# Patient Record
Sex: Male | Born: 1970 | Race: White | Hispanic: No | Marital: Married | Smoking: Former smoker
Health system: Southern US, Community
[De-identification: ages and names within clinical notes are randomized; demographics above are authoritative.]

## PROBLEM LIST (undated history)

## (undated) DIAGNOSIS — I10 Essential (primary) hypertension: Secondary | ICD-10-CM

## (undated) DIAGNOSIS — I2510 Atherosclerotic heart disease of native coronary artery without angina pectoris: Secondary | ICD-10-CM

## (undated) DIAGNOSIS — R569 Unspecified convulsions: Secondary | ICD-10-CM

## (undated) DIAGNOSIS — I219 Acute myocardial infarction, unspecified: Secondary | ICD-10-CM

## (undated) DIAGNOSIS — K219 Gastro-esophageal reflux disease without esophagitis: Secondary | ICD-10-CM

## (undated) DIAGNOSIS — R002 Palpitations: Secondary | ICD-10-CM

## (undated) HISTORY — PX: CHOLECYSTECTOMY: SHX55

## (undated) HISTORY — PX: APPENDECTOMY: SHX54

## (undated) HISTORY — PX: BRAIN SURGERY: SHX531

---

## 1997-08-17 DIAGNOSIS — I2510 Atherosclerotic heart disease of native coronary artery without angina pectoris: Secondary | ICD-10-CM

## 1997-08-17 HISTORY — DX: Atherosclerotic heart disease of native coronary artery without angina pectoris: I25.10

## 2001-01-18 ENCOUNTER — Ambulatory Visit (HOSPITAL_COMMUNITY): Admission: RE | Admit: 2001-01-18 | Discharge: 2001-01-18 | Payer: Self-pay | Admitting: Internal Medicine

## 2001-01-18 ENCOUNTER — Encounter: Payer: Self-pay | Admitting: Internal Medicine

## 2001-01-27 ENCOUNTER — Ambulatory Visit (HOSPITAL_COMMUNITY): Admission: RE | Admit: 2001-01-27 | Discharge: 2001-01-27 | Payer: Self-pay | Admitting: Internal Medicine

## 2001-01-27 ENCOUNTER — Encounter: Payer: Self-pay | Admitting: Internal Medicine

## 2001-02-11 ENCOUNTER — Ambulatory Visit (HOSPITAL_COMMUNITY): Admission: RE | Admit: 2001-02-11 | Discharge: 2001-02-11 | Payer: Self-pay | Admitting: Internal Medicine

## 2001-02-28 ENCOUNTER — Other Ambulatory Visit: Admission: RE | Admit: 2001-02-28 | Discharge: 2001-02-28 | Payer: Self-pay | Admitting: Internal Medicine

## 2001-03-03 ENCOUNTER — Encounter: Payer: Self-pay | Admitting: Internal Medicine

## 2001-03-03 ENCOUNTER — Ambulatory Visit (HOSPITAL_COMMUNITY): Admission: RE | Admit: 2001-03-03 | Discharge: 2001-03-03 | Payer: Self-pay | Admitting: Internal Medicine

## 2001-03-04 ENCOUNTER — Other Ambulatory Visit: Admission: RE | Admit: 2001-03-04 | Discharge: 2001-03-04 | Payer: Self-pay | Admitting: Internal Medicine

## 2001-03-04 ENCOUNTER — Encounter (INDEPENDENT_AMBULATORY_CARE_PROVIDER_SITE_OTHER): Payer: Self-pay | Admitting: Specialist

## 2001-05-05 ENCOUNTER — Ambulatory Visit (HOSPITAL_COMMUNITY): Admission: RE | Admit: 2001-05-05 | Discharge: 2001-05-05 | Payer: Self-pay | Admitting: Internal Medicine

## 2001-07-18 ENCOUNTER — Encounter: Payer: Self-pay | Admitting: Emergency Medicine

## 2001-07-18 ENCOUNTER — Inpatient Hospital Stay (HOSPITAL_COMMUNITY): Admission: EM | Admit: 2001-07-18 | Discharge: 2001-07-22 | Payer: Self-pay | Admitting: Cardiology

## 2001-07-18 ENCOUNTER — Encounter: Payer: Self-pay | Admitting: Cardiology

## 2001-07-19 ENCOUNTER — Encounter: Payer: Self-pay | Admitting: Cardiology

## 2001-07-28 ENCOUNTER — Encounter: Payer: Self-pay | Admitting: Emergency Medicine

## 2001-07-28 ENCOUNTER — Inpatient Hospital Stay (HOSPITAL_COMMUNITY): Admission: AD | Admit: 2001-07-28 | Discharge: 2001-07-29 | Payer: Self-pay | Admitting: Cardiology

## 2001-11-26 ENCOUNTER — Emergency Department (HOSPITAL_COMMUNITY): Admission: EM | Admit: 2001-11-26 | Discharge: 2001-11-27 | Payer: Self-pay | Admitting: Internal Medicine

## 2001-11-27 ENCOUNTER — Encounter: Payer: Self-pay | Admitting: Internal Medicine

## 2002-05-04 ENCOUNTER — Observation Stay (HOSPITAL_COMMUNITY): Admission: EM | Admit: 2002-05-04 | Discharge: 2002-05-05 | Payer: Self-pay | Admitting: *Deleted

## 2002-05-04 ENCOUNTER — Encounter: Payer: Self-pay | Admitting: *Deleted

## 2002-06-08 ENCOUNTER — Ambulatory Visit (HOSPITAL_COMMUNITY): Admission: RE | Admit: 2002-06-08 | Discharge: 2002-06-08 | Payer: Self-pay | Admitting: Internal Medicine

## 2004-07-07 ENCOUNTER — Ambulatory Visit: Payer: Self-pay | Admitting: Internal Medicine

## 2004-07-08 ENCOUNTER — Ambulatory Visit (HOSPITAL_COMMUNITY): Admission: RE | Admit: 2004-07-08 | Discharge: 2004-07-08 | Payer: Self-pay | Admitting: Internal Medicine

## 2004-07-22 ENCOUNTER — Ambulatory Visit (HOSPITAL_COMMUNITY): Admission: RE | Admit: 2004-07-22 | Discharge: 2004-07-22 | Payer: Self-pay | Admitting: Internal Medicine

## 2004-07-22 ENCOUNTER — Ambulatory Visit: Payer: Self-pay | Admitting: Internal Medicine

## 2004-09-03 ENCOUNTER — Ambulatory Visit: Payer: Self-pay | Admitting: Cardiology

## 2005-02-23 ENCOUNTER — Ambulatory Visit: Payer: Self-pay | Admitting: Cardiology

## 2005-02-27 ENCOUNTER — Ambulatory Visit: Payer: Self-pay

## 2005-02-27 ENCOUNTER — Ambulatory Visit: Payer: Self-pay | Admitting: Cardiology

## 2005-02-27 ENCOUNTER — Encounter: Payer: Self-pay | Admitting: Cardiovascular Disease

## 2005-04-07 ENCOUNTER — Ambulatory Visit: Payer: Self-pay

## 2005-06-04 ENCOUNTER — Ambulatory Visit: Payer: Self-pay | Admitting: Orthopedic Surgery

## 2005-06-29 ENCOUNTER — Ambulatory Visit: Payer: Self-pay | Admitting: Orthopedic Surgery

## 2005-08-04 ENCOUNTER — Ambulatory Visit: Payer: Self-pay | Admitting: Family Medicine

## 2005-11-30 ENCOUNTER — Ambulatory Visit: Payer: Self-pay | Admitting: Orthopedic Surgery

## 2006-01-19 ENCOUNTER — Ambulatory Visit: Payer: Self-pay | Admitting: Family Medicine

## 2006-04-06 ENCOUNTER — Ambulatory Visit: Payer: Self-pay | Admitting: Cardiology

## 2006-04-20 ENCOUNTER — Ambulatory Visit: Payer: Self-pay | Admitting: Family Medicine

## 2006-06-17 ENCOUNTER — Ambulatory Visit: Payer: Self-pay | Admitting: Orthopedic Surgery

## 2006-07-14 ENCOUNTER — Ambulatory Visit: Payer: Self-pay | Admitting: Family Medicine

## 2006-07-16 ENCOUNTER — Encounter: Payer: Self-pay | Admitting: Family Medicine

## 2006-07-16 DIAGNOSIS — K219 Gastro-esophageal reflux disease without esophagitis: Secondary | ICD-10-CM | POA: Insufficient documentation

## 2006-07-16 DIAGNOSIS — O88219 Thromboembolism in pregnancy, unspecified trimester: Secondary | ICD-10-CM | POA: Insufficient documentation

## 2006-07-16 DIAGNOSIS — E785 Hyperlipidemia, unspecified: Secondary | ICD-10-CM

## 2006-07-16 DIAGNOSIS — K648 Other hemorrhoids: Secondary | ICD-10-CM | POA: Insufficient documentation

## 2006-07-16 DIAGNOSIS — I2510 Atherosclerotic heart disease of native coronary artery without angina pectoris: Secondary | ICD-10-CM

## 2006-07-16 DIAGNOSIS — I10 Essential (primary) hypertension: Secondary | ICD-10-CM

## 2006-07-16 DIAGNOSIS — G40909 Epilepsy, unspecified, not intractable, without status epilepticus: Secondary | ICD-10-CM | POA: Insufficient documentation

## 2006-08-26 ENCOUNTER — Ambulatory Visit: Payer: Self-pay | Admitting: Orthopedic Surgery

## 2006-10-25 ENCOUNTER — Encounter (INDEPENDENT_AMBULATORY_CARE_PROVIDER_SITE_OTHER): Payer: Self-pay | Admitting: Family Medicine

## 2006-11-10 ENCOUNTER — Telehealth (INDEPENDENT_AMBULATORY_CARE_PROVIDER_SITE_OTHER): Payer: Self-pay | Admitting: Family Medicine

## 2006-12-14 ENCOUNTER — Ambulatory Visit: Payer: Self-pay | Admitting: Family Medicine

## 2006-12-14 DIAGNOSIS — J301 Allergic rhinitis due to pollen: Secondary | ICD-10-CM | POA: Insufficient documentation

## 2006-12-14 DIAGNOSIS — R42 Dizziness and giddiness: Secondary | ICD-10-CM

## 2006-12-15 ENCOUNTER — Encounter (INDEPENDENT_AMBULATORY_CARE_PROVIDER_SITE_OTHER): Payer: Self-pay | Admitting: Family Medicine

## 2006-12-15 LAB — CONVERTED CEMR LAB
ALT: 30 units/L (ref 0–53)
AST: 18 units/L (ref 0–37)
Albumin: 4.2 g/dL (ref 3.5–5.2)
Alkaline Phosphatase: 103 units/L (ref 39–117)
BUN: 11 mg/dL (ref 6–23)
CO2: 19 meq/L (ref 19–32)
Calcium: 9.2 mg/dL (ref 8.4–10.5)
Chloride: 103 meq/L (ref 96–112)
Creatinine, Ser: 0.94 mg/dL (ref 0.40–1.50)
Glucose, Bld: 87 mg/dL (ref 70–99)
Phenytoin Lvl: 7.8 ug/mL — ABNORMAL LOW (ref 10.0–20.0)
Potassium: 4.4 meq/L (ref 3.5–5.3)
Sodium: 136 meq/L (ref 135–145)
Total Bilirubin: 0.5 mg/dL (ref 0.3–1.2)
Total Protein: 7.4 g/dL (ref 6.0–8.3)

## 2007-02-25 ENCOUNTER — Ambulatory Visit (HOSPITAL_COMMUNITY): Admission: RE | Admit: 2007-02-25 | Discharge: 2007-02-25 | Payer: Self-pay | Admitting: Preventative Medicine

## 2007-05-06 ENCOUNTER — Ambulatory Visit: Payer: Self-pay | Admitting: Cardiology

## 2007-05-10 ENCOUNTER — Encounter (INDEPENDENT_AMBULATORY_CARE_PROVIDER_SITE_OTHER): Payer: Self-pay | Admitting: Family Medicine

## 2007-06-05 ENCOUNTER — Encounter: Payer: Self-pay | Admitting: Orthopedic Surgery

## 2007-06-20 ENCOUNTER — Ambulatory Visit: Payer: Self-pay | Admitting: Family Medicine

## 2007-06-20 LAB — CONVERTED CEMR LAB: Rapid Strep: NEGATIVE

## 2007-08-29 ENCOUNTER — Encounter (INDEPENDENT_AMBULATORY_CARE_PROVIDER_SITE_OTHER): Payer: Self-pay | Admitting: Family Medicine

## 2007-09-26 ENCOUNTER — Ambulatory Visit: Payer: Self-pay | Admitting: Internal Medicine

## 2007-11-15 ENCOUNTER — Ambulatory Visit: Payer: Self-pay | Admitting: Family Medicine

## 2007-11-15 DIAGNOSIS — J029 Acute pharyngitis, unspecified: Secondary | ICD-10-CM | POA: Insufficient documentation

## 2007-11-15 LAB — CONVERTED CEMR LAB: Rapid Strep: NEGATIVE

## 2008-05-04 ENCOUNTER — Ambulatory Visit: Payer: Self-pay | Admitting: Family Medicine

## 2008-05-14 ENCOUNTER — Ambulatory Visit: Payer: Self-pay | Admitting: Cardiology

## 2008-05-19 ENCOUNTER — Telehealth (INDEPENDENT_AMBULATORY_CARE_PROVIDER_SITE_OTHER): Payer: Self-pay | Admitting: Family Medicine

## 2008-05-23 ENCOUNTER — Ambulatory Visit: Payer: Self-pay | Admitting: Orthopedic Surgery

## 2008-05-23 DIAGNOSIS — M79609 Pain in unspecified limb: Secondary | ICD-10-CM

## 2008-05-23 DIAGNOSIS — M7150 Other bursitis, not elsewhere classified, unspecified site: Secondary | ICD-10-CM

## 2008-07-10 ENCOUNTER — Encounter (INDEPENDENT_AMBULATORY_CARE_PROVIDER_SITE_OTHER): Payer: Self-pay | Admitting: Family Medicine

## 2008-10-04 DIAGNOSIS — I2510 Atherosclerotic heart disease of native coronary artery without angina pectoris: Secondary | ICD-10-CM | POA: Insufficient documentation

## 2009-04-18 ENCOUNTER — Encounter: Payer: Self-pay | Admitting: Cardiology

## 2009-07-09 ENCOUNTER — Ambulatory Visit: Payer: Self-pay | Admitting: Cardiology

## 2009-07-15 ENCOUNTER — Telehealth (INDEPENDENT_AMBULATORY_CARE_PROVIDER_SITE_OTHER): Payer: Self-pay | Admitting: *Deleted

## 2009-07-16 ENCOUNTER — Ambulatory Visit: Payer: Self-pay | Admitting: Cardiovascular Disease

## 2009-07-16 ENCOUNTER — Ambulatory Visit: Payer: Self-pay

## 2009-07-16 ENCOUNTER — Encounter (HOSPITAL_COMMUNITY): Admission: RE | Admit: 2009-07-16 | Discharge: 2009-08-14 | Payer: Self-pay | Admitting: Cardiology

## 2009-07-17 ENCOUNTER — Observation Stay (HOSPITAL_COMMUNITY): Admission: AD | Admit: 2009-07-17 | Discharge: 2009-07-19 | Payer: Self-pay | Admitting: Cardiology

## 2009-07-17 ENCOUNTER — Telehealth: Payer: Self-pay | Admitting: Cardiology

## 2009-07-17 ENCOUNTER — Encounter: Payer: Self-pay | Admitting: Emergency Medicine

## 2009-07-17 ENCOUNTER — Ambulatory Visit: Payer: Self-pay | Admitting: Cardiology

## 2009-07-18 ENCOUNTER — Encounter: Payer: Self-pay | Admitting: Cardiology

## 2009-08-28 ENCOUNTER — Telehealth: Payer: Self-pay | Admitting: Cardiology

## 2009-08-29 ENCOUNTER — Encounter (INDEPENDENT_AMBULATORY_CARE_PROVIDER_SITE_OTHER): Payer: Self-pay

## 2009-09-05 ENCOUNTER — Ambulatory Visit: Payer: Self-pay | Admitting: Cardiology

## 2009-09-05 DIAGNOSIS — I4949 Other premature depolarization: Secondary | ICD-10-CM

## 2009-12-25 ENCOUNTER — Encounter (INDEPENDENT_AMBULATORY_CARE_PROVIDER_SITE_OTHER): Payer: Self-pay | Admitting: *Deleted

## 2010-01-22 ENCOUNTER — Telehealth: Payer: Self-pay | Admitting: Cardiology

## 2010-01-23 ENCOUNTER — Ambulatory Visit: Payer: Self-pay | Admitting: Cardiology

## 2010-01-23 DIAGNOSIS — R002 Palpitations: Secondary | ICD-10-CM | POA: Insufficient documentation

## 2010-04-29 ENCOUNTER — Ambulatory Visit: Payer: Self-pay | Admitting: Cardiology

## 2010-04-29 ENCOUNTER — Encounter: Payer: Self-pay | Admitting: Cardiology

## 2010-05-19 ENCOUNTER — Telehealth: Payer: Self-pay | Admitting: Cardiology

## 2010-05-20 ENCOUNTER — Ambulatory Visit: Payer: Self-pay | Admitting: Cardiology

## 2010-05-20 ENCOUNTER — Ambulatory Visit: Payer: Self-pay

## 2010-05-20 ENCOUNTER — Ambulatory Visit (HOSPITAL_COMMUNITY): Admission: RE | Admit: 2010-05-20 | Discharge: 2010-05-20 | Payer: Self-pay

## 2010-05-20 ENCOUNTER — Encounter: Payer: Self-pay | Admitting: Cardiology

## 2010-05-20 DIAGNOSIS — R109 Unspecified abdominal pain: Secondary | ICD-10-CM | POA: Insufficient documentation

## 2010-05-21 ENCOUNTER — Encounter: Payer: Self-pay | Admitting: Cardiology

## 2010-05-26 ENCOUNTER — Telehealth: Payer: Self-pay | Admitting: Cardiology

## 2010-06-04 ENCOUNTER — Ambulatory Visit: Payer: Self-pay | Admitting: Cardiology

## 2010-06-11 ENCOUNTER — Ambulatory Visit: Payer: Self-pay | Admitting: Internal Medicine

## 2010-06-25 ENCOUNTER — Telehealth (INDEPENDENT_AMBULATORY_CARE_PROVIDER_SITE_OTHER): Payer: Self-pay | Admitting: *Deleted

## 2010-08-22 ENCOUNTER — Ambulatory Visit: Admit: 2010-08-22 | Payer: Self-pay | Admitting: Cardiology

## 2010-09-14 LAB — CONVERTED CEMR LAB
BUN: 12 mg/dL (ref 6–23)
CO2: 27 meq/L (ref 19–32)
Calcium: 9.1 mg/dL (ref 8.4–10.5)
Chloride: 106 meq/L (ref 96–112)
Creatinine, Ser: 0.9 mg/dL (ref 0.4–1.5)
Ferritin: 57.1 ng/mL (ref 22.0–322.0)
GFR calc non Af Amer: 96.84 mL/min (ref >60)
Glucose, Bld: 97 mg/dL (ref 70–99)
Magnesium: 2.3 mg/dL (ref 1.5–2.5)
Potassium: 4.1 meq/L (ref 3.5–5.1)
Sodium: 139 meq/L (ref 135–145)
TSH: 1.35 microintl units/mL (ref 0.35–5.50)

## 2010-09-18 NOTE — Assessment & Plan Note (Signed)
Summary: EPH   Visit Type:  Post-hospital Primary Provider:  Franchot Heidelberg, MD  CC:  palpitations- Chest pain.  History of Present Illness: Patient has had some mild chest discomfort from time to time, but no prediictable episodes of pain.  The medicine seems to have helped some.  Data reviewed in detail with patient and wife.  He forgets to take his Dilantin.  Also, monitor strips reviewed in detail with patient, and demonstrate mostly singlet, unifocal VPB's  Current Medications (verified): 1)  Phenytoin Sodium Extended 100 Mg Caps (Phenytoin Sodium Extended) .... 2 Tablets Two Times A Day 2)  Prilosec 20 Mg Cpdr (Omeprazole) .... Two Times A Day 3)  Aspirin 81 Mg Tbec (Aspirin) .... Once Daily 4)  Nitro-Dur 0.4 Mg/hr Pt24 (Nitroglycerin) .... As Needed 5)  Crestor 40 Mg Tabs (Rosuvastatin Calcium) .... Take One Tablet By Mouth Daily. 6)  Metoprolol Succinate 25 Mg Xr24h-Tab (Metoprolol Succinate) .... Take One Tablet By Mouth Daily  Allergies (verified): No Known Drug Allergies  Vital Signs:  Patient profile:   40 year old male Height:      68 inches Weight:      177.75 pounds BMI:     27.12 Pulse rate:   68 / minute Pulse rhythm:   regular Resp:     18 per minute BP sitting:   122 / 70  (left arm) Cuff size:   large  Vitals Entered By: Vikki Ports (September 05, 2009 3:05 PM)  Physical Exam  General:  Well developed, well nourished, in no acute distress. Neck:  Neck supple, no JVD. No masses, thyromegaly or abnormal cervical nodes. Lungs:  Clear bilaterally to auscultation and percussion. Heart:  Non-displaced PMI, chest non-tender; regular rate and rhythm, S1, S2 without murmurs, rubs or gallops. Carotid upstroke normal, no bruit. Normal abdominal aortic size, no bruits. Femorals normal pulses, no bruits. Pedals normal pulses. No edema, no varicosities. Abdomen:  Bowel sounds positive; abdomen soft and non-tender without masses, organomegaly, or hernias noted. No  hepatosplenomegaly. Pulses:  pulses normal in all 4 extremities Extremities:  No clubbing or cyanosis. Neurologic:  Alert and oriented x 3.   EKG  Procedure date:  09/05/2009  Findings:      NSR. WNL.  Cardiac Cath  Procedure date:  07/18/2009  Findings:      ANGIOGRAPHIC DATA: 1. On plain fluoroscopy, there was a fair amount of calcification in     the coronary vessels.  Especially for age. 2. Ventriculography was done in the RAO projection.  Overall ejection     fraction will be estimated around 45-50% with an inferobasal area     of severe hypo-to-near akinesis. 3. The left main is free of critical disease.  It is slightly tapered     near the ostium. 4. The left anterior descending artery courses to the apex.  There is     very mild progression of proximal plaquing with about 30-40%     narrowing with a long area of calcification at the proximal mid     vessel.  None of the mid, distal vessel, or diagonal branch appears     to be critically narrow.  Luminal irregularities are noted. 5. The circumflex has about 40% ostial narrowing.  He is less     constructed than on the diagnostic study from 2002.  There is     calcification of the proximal bend after a long left main.  There     is a hypodense lesion noted  leading into the marginal.  This was     carefully on the LAO views.  However, the area appears to be     relatively smooth.  The distal vessel terminates as a circumflex     marginal with 2 sub-branches and noncritical disease despite     luminal irregularities. 6. The right coronary artery demonstrates an area of stented vessel in     the proximal mid artery.  There are overlapping stents.  There is     some mild tissue about 40% narrowing, but does not appear to be     high-grade.  There is calcification in the distal vessel with about     30% plaquing and also mild bifurcational disease of the PDA takeoff     and the continuation branch.  Critical disease is  not noted.   CONCLUSION: 1. Inferobasal hypokinesis to akinesis with mild reduction in left     ventricular function. 2. Frequent ventricular ectopy, mainly has trigeminy and bigeminy with     symptoms. 3. Patent stents from the previous myocardial infarction. 4. Moderate circumflex disease compared to the previous study,  Impression & Recommendations:  Problem # 1:  CAD, NATIVE VESSEL (ICD-414.01) RCA remains patent.  CFX looks similar to 2002 film.  Reviewed in detail with patient and wife, including film review.   The following medications were removed from the medication list:    Atenolol 25 Mg Tabs (Atenolol) ..... Once daily His updated medication list for this problem includes:    Aspirin 81 Mg Tbec (Aspirin) ..... Once daily    Nitro-dur 0.4 Mg/hr Pt24 (Nitroglycerin) .Marland Kitchen... As needed    Metoprolol Succinate 50 Mg Xr24h-tab (Metoprolol succinate) .Marland Kitchen... Take one tablet by mouth daily  Orders: EKG w/ Interpretation (93000)  Problem # 2:  PREMATURE VENTRICULAR CONTRACTIONS (ICD-427.69) See monitor results. reviewed in detail.  Will increase beta blocker to 50mg  daily.  Continue to watch. The following medications were removed from the medication list:    Atenolol 25 Mg Tabs (Atenolol) ..... Once daily His updated medication list for this problem includes:    Aspirin 81 Mg Tbec (Aspirin) ..... Once daily    Nitro-dur 0.4 Mg/hr Pt24 (Nitroglycerin) .Marland Kitchen... As needed    Metoprolol Succinate 50 Mg Xr24h-tab (Metoprolol succinate) .Marland Kitchen... Take one tablet by mouth daily  Problem # 3:  SEIZURE DISORDER (ICD-780.39) Needs to resume normal dose with followup Dr. Sharene Skeans. His updated medication list for this problem includes:    Phenytoin Sodium Extended 100 Mg Caps (Phenytoin sodium extended) .Marland Kitchen... 2 tablets two times a day  Problem # 4:  HYPERLIPIDEMIA (ICD-272.4) Will see in close followup.  Will need lipid and liver profile. The following medications were removed from the  medication list:    Crestor 20 Mg Tabs (Rosuvastatin calcium) ..... Once daily His updated medication list for this problem includes:    Crestor 40 Mg Tabs (Rosuvastatin calcium) .Marland Kitchen... Take one tablet by mouth daily.  Patient Instructions: 1)  Your physician recommends that you schedule a follow-up appointment in: 1 MONTH 2)  Your physician has recommended you make the following change in your medication: INCREASE Metoprolol Succinate to 50mg  one tablet daily Prescriptions: METOPROLOL SUCCINATE 50 MG XR24H-TAB (METOPROLOL SUCCINATE) Take one tablet by mouth daily  #30 x 11   Entered by:   Julieta Gutting, RN, BSN   Authorized by:   Ronaldo Miyamoto, MD, [facility]   Signed by:   Julieta Gutting, RN, BSN on 09/05/2009   Method  used:   Electronically to        [facility] (retail)       [address]       [place], [place]  [address]       Ph: [phone] or [phone]       Fax: [phone]   RxID:   [id number]

## 2010-09-18 NOTE — Progress Notes (Signed)
Summary: fluttering  Phone Note Call from Patient Call back at Home Phone [phone]   Caller: Spouse Reason for Call: Talk to Nurse Summary of Call: pt having fluttering... happened several times in the past week, request call back Initial call taken by: Migdalia Dk,  January 22, 2010 10:57 AM  Follow-up for Phone Call        Pt. c/o of having fast heart rate and skiping beats for the past week. These episodes lasted for 10 to 15 minutes on sunday.On monday and this AM the fast heart rate and skips lasted for an hr.  Pt states missed appointment on 12/01/09. He would like to be seen soon. An appointment was made for pt in Dr. Rosalyn Charters clinic on 01/23/10 at 11:45 AM. Pt aware. Follow-up by: Ollen Gross, RN, BSN,  January 22, 2010 11:45 AM

## 2010-09-18 NOTE — Assessment & Plan Note (Signed)
Summary: ? VT  add on per Dr Riley Kill     Primary Marietta Sikkema:  Andre [name]  CC:  dizziness...pt states his heart rate has been increased .  History of Present Illness: 40 year old male with past medical history of coronary disease for evaluation of palpitations. Previous PCI of his right coronary artery in 2002. Last cardiac catheterization performed in December 2010 and showed a an ejection fraction of 45-50%. There was a 40% LAD, a 40% circumflex and a 40% right coronary artery which has been treated medically.  Last echocardiogram in December of 2010 showed an ejection fraction of 55-60%, inferior hypokinesis and mild mitral regurgitation. Patient also with history of palpitations and monitor in December of 2010 showed PVCs. He contacted the office with increased palpitations and I was asked to further evaluate. The patient states that recently he is having increased palpitations. He describes these as a skip but also had sustained racing. There is associated dizziness but no syncope. There is no associated chest pain, shortness of breath. His sustained palpitations last anywhere from seconds to minutes. They resolve spontaneously. He otherwise denies dyspnea on exertion, orthopnea or exertional chest pain.  Current Medications (verified): 1)  Phenytoin Sodium Extended 100 Mg Caps (Phenytoin Sodium Extended) .... 2 Tablets Two Times A Day 2)  Prilosec 20 Mg Cpdr (Omeprazole) .... Two Times A Day 3)  Aspirin Ec 325 Mg Tbec (Aspirin) .... Take One Tablet By Mouth Daily 4)  Nitrostat 0.4 Mg Subl (Nitroglycerin) .Marland Kitchen.. 1 Tablet Under Tongue At Onset of Chest Pain; You May Repeat Every 5 Minutes For Up To 3 Doses. 5)  Crestor 40 Mg Tabs (Rosuvastatin Calcium) .... Take One Tablet By Mouth Daily. 6)  Metoprolol Succinate 50 Mg Xr24h-Tab (Metoprolol Succinate) .Marland Kitchen.. 1 1/2 Tab By Mouth Once Daily  Allergies: No Known Drug Allergies  Past History:  Past Medical History: CAD, NATIVE VESSEL  (ICD-414.01) TRIGGER FINGER, THUMB - BILATERAL (ICD-727.03) EMBOLISM, OB BLOOD-CLOT , UNSPECIFIED EPISODE (ICD-673.20) HEMORRHOIDS, INTERNAL (ICD-455.0) SEIZURE DISORDER (ICD-780.39) HYPERTENSION (ICD-401.9) HYPERLIPIDEMIA (ICD-272.4) GERD (ICD-530.81) CORONARY ARTERY DISEASE (ICD-414.00)S/P INFERIOR MI RX STENT RCA 07/18/01, RESIDUAL 70% CFX, 40-50% MID CFX, 40% LAD, 70%DX EF47%. 53% MYOVIEW 7/06    Coronary artery disease GERD Hyperlipidemia Hypertension Seizure disorder  Past Surgical History: Appendectomy Cholecystectomy DMI, treated with a bare-metal stent to the  right coronary in 2002.  Social History: Reviewed history from 10/04/2009 and no changes required. Married Alcohol use-no Drug use-no   He works in Production designer, theatre/television/film, Engineer, manufacturing systems   facilities for the [employer]  He does not smoke.      Review of Systems       Problems with abdominal pain but no fevers or chills, productive cough, hemoptysis, dysphasia, odynophagia, melena, hematochezia, dysuria, hematuria, rash, seizure activity, orthopnea, PND, pedal edema, claudication. Remaining systems are negative.   Vital Signs:  Patient profile:   40 year old male Height:      68 inches Weight:      181 pounds BMI:     27.62 Pulse rate:   70 / minute Resp:     14 per minute BP sitting:   124 / 77  (left arm)  Vitals Entered By: Kem Parkinson (May 20, 2010 2:27 PM)  Physical Exam  General:  Well-developed well-nourished in no acute distress.  Skin is warm and dry.  HEENT is normal.  Neck is supple. No thyromegaly.  Chest is clear to auscultation with normal expansion.  Cardiovascular exam is regular rate and rhythm.  Abdominal exam not  distended. No masses palpated. Mild diffuse tenderness. Extremities show no edema. neuro grossly intact    Impression & Recommendations:  Problem # 1:  PALPITATIONS (ICD-785.1) Etiology unclear. Check TSH, magnesium and potassium. Repeat  echocardiogram. Check cardiac monitor. Continue beta blocker. His updated medication list for this problem includes:    Aspirin Ec 325 Mg Tbec (Aspirin) .Marland Kitchen... Take one tablet by mouth daily    Nitrostat 0.4 Mg Subl (Nitroglycerin) .Marland Kitchen... 1 tablet under tongue at onset of chest pain; you may repeat every 5 minutes for up to 3 doses.    Metoprolol Succinate 50 Mg Xr24h-tab (Metoprolol succinate) .Marland Kitchen... 1 1/2 tab by mouth once daily  Orders: EKG w/ Interpretation (93000) TLB-BMP (Basic Metabolic Panel-BMET) (80048-METABOL) TLB-TSH (Thyroid Stimulating Hormone) (84443-TSH) TLB-Magnesium (Mg) (83735-MG) Event (Event) Echocardiogram (Echo)  Problem # 2:  CAD, NATIVE VESSEL (ICD-414.01)  No recent chest pain. Continue aspirin and beta blocker as well as statin. His updated medication list for this problem includes:    Aspirin Ec 325 Mg Tbec (Aspirin) .Marland Kitchen... Take one tablet by mouth daily    Nitrostat 0.4 Mg Subl (Nitroglycerin) .Marland Kitchen... 1 tablet under tongue at onset of chest pain; you may repeat every 5 minutes for up to 3 doses.    Metoprolol Succinate 50 Mg Xr24h-tab (Metoprolol succinate) .Marland Kitchen... 1 1/2 tab by mouth once daily  His updated medication list for this problem includes:    Aspirin Ec 325 Mg Tbec (Aspirin) .Marland Kitchen... Take one tablet by mouth daily    Nitrostat 0.4 Mg Subl (Nitroglycerin) .Marland Kitchen... 1 tablet under tongue at onset of chest pain; you may repeat every 5 minutes for up to 3 doses.    Metoprolol Succinate 50 Mg Xr24h-tab (Metoprolol succinate) .Marland Kitchen... 1 1/2 tab by mouth once daily  Problem # 3:  HYPERTENSION (ICD-401.9)  Blood pressure controlled on present medications. Will continue. His updated medication list for this problem includes:    Aspirin Ec 325 Mg Tbec (Aspirin) .Marland Kitchen... Take one tablet by mouth daily    Metoprolol Succinate 50 Mg Xr24h-tab (Metoprolol succinate) .Marland Kitchen... 1 1/2 tab by mouth once daily  His updated medication list for this problem includes:    Aspirin Ec 325 Mg  Tbec (Aspirin) .Marland Kitchen... Take one tablet by mouth daily    Metoprolol Succinate 50 Mg Xr24h-tab (Metoprolol succinate) .Marland Kitchen... 1 1/2 tab by mouth once daily  Problem # 4:  HYPERLIPIDEMIA (ICD-272.4)  Continue statin. His updated medication list for this problem includes:    Crestor 40 Mg Tabs (Rosuvastatin calcium) .Marland Kitchen... Take one tablet by mouth daily.  His updated medication list for this problem includes:    Crestor 40 Mg Tabs (Rosuvastatin calcium) .Marland Kitchen... Take one tablet by mouth daily.  Problem # 5:  GERD (ICD-530.81)  His updated medication list for this problem includes:    Prilosec 20 Mg Cpdr (Omeprazole) .Marland Kitchen..Marland Kitchen Two times a day  His updated medication list for this problem includes:    Prilosec 20 Mg Cpdr (Omeprazole) .Marland Kitchen..Marland Kitchen Two times a day  Problem # 6:  ABDOMINAL PAIN OTHER SPECIFIED SITE (ICD-789.09) Patient has mild abdominal pain. He apparently discussed this with Dr. Riley Kill previously. I've asked him to followup with his primary care physician for this issue.  Patient Instructions: 1)  Your physician recommends that you schedule a follow-up appointment in: 2 WEEKS WITH DR Riley Kill 2)  Your physician recommends that you return for lab work ZO:XWRUE BMET MAG TSH 785.1 3)  Your physician recommends that you continue  on your current medications as directed. Please refer to the Current Medication list given to you today. 4)  Your physician has requested that you have an echocardiogram.  Echocardiography is a painless test that uses sound waves to create images of your heart. It provides your doctor with information about the size and shape of your heart and how well your heart's chambers and valves are working.  This procedure takes approximately one hour. There are no restrictions for this procedure. 5)  Your physician has recommended that you wear an event monitor CARDIONET  Event monitors are medical devices that record the heart's electrical activity. Doctors most often use these  monitors to diagnose arrhythmias. Arrhythmias are problems with the speed or rhythm of the heartbeat. The monitor is a small, portable device. You can wear one while you do your normal daily activities. This is usually used to diagnose what is causing palpitations/syncope (passing out).

## 2010-09-18 NOTE — Progress Notes (Signed)
Summary: prob with monitor  Phone Note Call from Patient   Caller: Spouse [phone] Reason for Call: Talk to Nurse Summary of Call: wife wants pt to get a call today, re heart monitor, it has broken twice, pt now has welps on chest  Spoke with patient, he received the 2 types of electrodes we carry at time of service,.... referred him to call the 1-800 number on the cell phone/monitor box to request sensitive skin electrodes from [employer].  Pt was pleasant. Stanton Kidney, EMT-P  May 27, 2010 6:25 PM  Initial call taken by: Glynda Jaeger,  May 26, 2010 11:01 AM  Follow-up for Phone Call        pt's wife returning call from yesterday-call pt at [phone] Glynda Jaeger  May 27, 2010 2:43 PM

## 2010-09-18 NOTE — Assessment & Plan Note (Signed)
Summary: for a week/Fast HR & skip beats/NM   Visit Type:  rov Primary Provider:  Mcgough  CC:  pt states he has had some racing heart rate x 1 week....no other complaints today.  History of Present Illness: Has had several episodes of where his heart would begin racing.  Three of four episdose have occurred.  He drinks alot of soda., and so he most recently has gotten on to G2, and Sprite.  The longest spell lasts about ten minutes.   He has not had further since he changed his drinks.  Denies any chest pain at all.  No exertional symptoms.  In December, thyroids and D dimer were normal.  Dilantin level was low at that point.   Current Medications (verified): 1)  Phenytoin Sodium Extended 100 Mg Caps (Phenytoin Sodium Extended) .... 2 Tablets Two Times A Day 2)  Prilosec 20 Mg Cpdr (Omeprazole) .... Two Times A Day 3)  Aspirin 81 Mg Tbec (Aspirin) .... Once Daily 4)  Nitro-Dur 0.4 Mg/hr Pt24 (Nitroglycerin) .... As Needed 5)  Crestor 40 Mg Tabs (Rosuvastatin Calcium) .... Take One Tablet By Mouth Daily. 6)  Metoprolol Succinate 50 Mg Xr24h-Tab (Metoprolol Succinate) .... Take One Tablet By Mouth Daily  Allergies (verified): No Known Drug Allergies  Past History:  Past Medical History: Last updated: 10/04/2008 Current Problems:  CAD, NATIVE VESSEL (ICD-414.01) FINGER PAIN (ICD-729.5) TRIGGER FINGER (ICD-727.3) TRIGGER FINGER, THUMB - BILATERAL (ICD-727.03) PHARYNGITIS, ACUTE (ICD-462) ALLERGIC RHINITIS, SEASONAL (ICD-477.0) DIZZINESS (ICD-780.4) EMBOLISM, OB BLOOD-CLOT , UNSPECIFIED EPISODE (ICD-673.20) HEMORRHOIDS, INTERNAL (ICD-455.0) SEIZURE DISORDER (ICD-780.39) HYPERTENSION (ICD-401.9) HYPERLIPIDEMIA (ICD-272.4) GERD (ICD-530.81) CORONARY ARTERY DISEASE (ICD-414.00)S/P INFERIOR MI RX STENT RCA 07/18/01, RESIDUAL 70% CFX, 40-50% MID CFX, 40% LAD, 70%DX EF47%. 53% MYOVIEW 7/06    Coronary artery disease GERD Hyperlipidemia Hypertension Seizure disorder  Past  Surgical History: Last updated: 10/04/2009 Appendectomy Cholecystectomy  DMI, treated with a bare-metal stent to the  right coronary in 2002.  Family History: Last updated: 10/04/2009  Positive, both his mother and father had coronary   artery disease with heart attack.   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  He does not smoke.      Vital Signs:  Patient profile:   40 year old male Height:      68 inches Weight:      174 pounds BMI:     26.55 Pulse rate:   82 / minute Pulse rhythm:   irregular BP sitting:   120 / 80  (left arm) Cuff size:   regular  Vitals Entered By: Danielle Rankin, CMA (January 23, 2010 12:53 PM)  Physical Exam  General:  Well developed, well nourished, in no acute distress. Head:  normocephalic and atraumatic Eyes:  PERRLA/EOM intact; conjunctiva and lids normal. Lungs:  Clear bilaterally to auscultation and percussion. Heart:  Non-displaced PMI, chest non-tender; regular rate and rhythm, S1, S2 without murmurs, rubs or gallops. Carotid upstroke normal, no bruit. Abdomen:  Bowel sounds positive; abdomen soft and non-tender without masses, organomegaly, or hernias noted. No hepatosplenomegaly. Pulses:  pulses normal in all 4 extremities Extremities:  No clubbing or cyanosis. Neurologic:  Alert and oriented x 3.   EKG  Procedure date:  01/23/2010  Findings:      NSR. WNL.  No acute abnormalities.    Event Monitor  Procedure date:  08/25/2009  Findings:      Had  one month event monitor with just singlet VPBs.  No significant arrythmias.    EKG  Procedure date:  01/23/2010  Findings:      Add to report.  Inferior MI, indeterminate age.  Rage VPB.    Echocardiogram  Procedure date:  07/26/2009  Findings:       Study Conclusions    1. Left ventricle: The cavity size was normal. Wall thickness was       increased in a pattern of mild LVH. Systolic function was normal.      The estimated ejection fraction was in the range of 55% to 60%.      There appeared to be basal posterior and inferior severe      hypokinesis to akinesis. Diastolic function is indeterminant.   2. Aortic valve: There was no stenosis.   3. Mitral valve: Mild regurgitation.   4. Left atrium: The atrium was mildly dilated.   5. Right ventricle: The cavity size was normal. Systolic function      was normal.   6. Pulmonary arteries: No TR doppler jet was measured so unable to      estimate PA systolic pressure.   7. Systemic veins: The IVC was not visualized.   Impressions:    - Normal LV size with EF 55-60%. There was mild LV hypertrophy. The     basal inferior and posterior walls were severely hypokinetic to     akinetic. There was mild mitral regurgitation. The RV appeared     normal.  CXR  Procedure date:  07/17/2009  Findings:       Clinical Data: Chest pain/rapid heart beat    PORTABLE CHEST - 1 VIEW    Comparison: Report of a prior chest x-ray 05/04/2002.    Findings: Heart size within normal limits considering AP   projection.  Lungs clear in one-view.  No pleural fluid or osseous   lesions.    IMPRESSION:   No active disease in one-view.    Read By:  Bernerd Limbo,  M.D.   Released By:  Bernerd Limbo,  M.D.  Cardiac Cath  Procedure date:  07/18/2009  Findings:       CONCLUSION:   1. Inferobasal hypokinesis to akinesis with mild reduction in left       ventricular function.   2. Frequent ventricular ectopy, mainly has trigeminy and bigeminy with       symptoms.   3. Patent stents from the previous myocardial infarction.   4. Moderate circumflex disease compared to the previous study, it may       not be progressed.      DISPOSITION:  We will get several opinions regarding the films.  It is   not clear that his symptoms are entirely from this.  We have nuclear   imaging data and we  will get a repeat echocardiogram to make sure that   there has not been a change in other territories as well.  Further   decisions are pending.   Nuclear ETT  Procedure date:  07/16/2009  Findings:      QPS  Raw Data Images:  Normal; no motion artifact; normal heart/lung ratio. Stress Images:  NI: Uniform and normal uptake of tracer in all myocardial segments. Rest Images:  Normal homogeneous uptake in all areas of the myocardium. Subtraction (SDS):  SDS 4 abnormal at inferobase Transient Ischemic Dilatation:  1.12  (Normal <1.22)  Lung/Heart Ratio:  .36  (Normal <0.45)  Quantitative Gated Spect  Images  QGS cine images:  non-gated study  Findings  Low risk nuclear study  Evidence for inferior infarct    Impression & Recommendations:  Problem # 1:  PALPITATIONS (ICD-785.1) symptoms have improved since he cut back on some caffeine.  He will continue with his change in diet.  If further symptoms, or any prolonged, then should come to the ER for rhythm strip.  Had event monitor with singlet VPBs noted.  See report.   His updated medication list for this problem includes:    Aspirin 81 Mg Tbec (Aspirin) ..... Once daily    Nitro-dur 0.4 Mg/hr Pt24 (Nitroglycerin) .Marland Kitchen... As needed    Metoprolol Succinate 50 Mg Xr24h-tab (Metoprolol succinate) .Marland Kitchen... Take one tablet by mouth daily  Problem # 2:  PREMATURE VENTRICULAR CONTRACTIONS (ICD-427.69) See report.  LV function normal by 2D echo.  His updated medication list for this problem includes:    Aspirin 81 Mg Tbec (Aspirin) ..... Once daily    Nitro-dur 0.4 Mg/hr Pt24 (Nitroglycerin) .Marland Kitchen... As needed    Metoprolol Succinate 50 Mg Xr24h-tab (Metoprolol succinate) .Marland Kitchen... Take one tablet by mouth daily  Problem # 3:  CAD, NATIVE VESSEL (ICD-414.01) Continues to remain stable at this point in time.  See extensive workup from December.  Had nuc, cath, echo.  No current chest pain.  Continue to monitor at this point in time.  His updated  medication list for this problem includes:    Aspirin 81 Mg Tbec (Aspirin) ..... Once daily    Nitro-dur 0.4 Mg/hr Pt24 (Nitroglycerin) .Marland Kitchen... As needed    Metoprolol Succinate 50 Mg Xr24h-tab (Metoprolol succinate) .Marland Kitchen... Take one tablet by mouth daily  Problem # 4:  HYPERLIPIDEMIA (ICD-272.4) Will need followup labs at some point.  His updated medication list for this problem includes:    Crestor 40 Mg Tabs (Rosuvastatin calcium) .Marland Kitchen... Take one tablet by mouth daily.  Patient Instructions: 1)  Your physician recommends that you schedule a follow-up appointment in: 6 MONTHS

## 2010-09-18 NOTE — Progress Notes (Signed)
Summary: EPS +/-ILR/LM  Phone Note Outgoing Call   Call placed by: Dennis Bast, RN, BSN,  June 25, 2010 4:43 PM Call placed to: Patient Summary of Call: spoke to patients wife in regards to setting up EPS +/- ILR Pt was put hunting and she will have him call me to schedule procedure  Initial call taken by: Dennis Bast, RN, BSN,  June 25, 2010 4:45 PM  Follow-up for Phone Call        Dr Ladona Ridgel aware pt has never returned call to schedule procedure Dennis Bast, RN, BSN  July 15, 2010 6:04 PM

## 2010-09-18 NOTE — Assessment & Plan Note (Signed)
Summary: ROV   Visit Type:  Follow-up Primary Provider:  Mcgough   History of Present Illness: He is on  call alot.  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  No chest pain.  Metoprolol worked pretty good.  Then the second month she got some other medication, and felt fast heart rate and palpitations.  Apparently got the wrong med.  Then in a couple of days felt better.    Current Medications (verified): 1)  Phenytoin Sodium Extended 100 Mg Caps (Phenytoin Sodium Extended) .... 2 Tablets Two Times A Day 2)  Prilosec 20 Mg Cpdr (Omeprazole) .... Two Times A Day 3)  Aspirin Ec 325 Mg Tbec (Aspirin) .... Take One Tablet By Mouth Daily 4)  Nitrostat 0.4 Mg Subl (Nitroglycerin) .Marland Kitchen.. 1 Tablet Under Tongue At Onset of Chest Pain; You May Repeat Every 5 Minutes For Up To 3 Doses. 5)  Crestor 40 Mg Tabs (Rosuvastatin Calcium) .... Take One Tablet By Mouth Daily. 6)  Metoprolol Succinate 50 Mg Xr24h-Tab (Metoprolol Succinate) .... Take One Tablet By Mouth Daily  Allergies (verified): No Known Drug Allergies  Past History:  Past Medical History: Last updated: 10/04/2008 Current Problems:  CAD, NATIVE VESSEL (ICD-414.01) FINGER PAIN (ICD-729.5) TRIGGER FINGER (ICD-727.3) TRIGGER FINGER, THUMB - BILATERAL (ICD-727.03) PHARYNGITIS, ACUTE (ICD-462) ALLERGIC RHINITIS, SEASONAL (ICD-477.0) DIZZINESS (ICD-780.4) EMBOLISM, OB BLOOD-CLOT , UNSPECIFIED EPISODE (ICD-673.20) HEMORRHOIDS, INTERNAL (ICD-455.0) SEIZURE DISORDER (ICD-780.39) HYPERTENSION (ICD-401.9) HYPERLIPIDEMIA (ICD-272.4) GERD (ICD-530.81) CORONARY ARTERY DISEASE (ICD-414.00)S/P INFERIOR MI RX STENT RCA 07/18/01, RESIDUAL 70% CFX, 40-50% MID CFX, 40% LAD, 70%DX EF47%. 53% MYOVIEW 7/06    Coronary artery disease GERD Hyperlipidemia Hypertension Seizure disorder  Past Surgical History: Last updated: 10/04/2009 Appendectomy Cholecystectomy  DMI, treated with a bare-metal stent to the  right coronary in 2002.  Family  History: Last updated: 10/04/2009  Positive, both his mother and father had coronary   artery disease with heart attack.   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  He does not smoke.      Risk Factors: Smoking Status: quit (12/14/2006)  Vital Signs:  Patient profile:   40 year old male Height:      68 inches Weight:      181.75 pounds BMI:     27.73 Pulse rate:   63 / minute Pulse rhythm:   irregular Resp:     18 per minute BP sitting:   134 / 82  (left arm) Cuff size:   large  Vitals Entered By: Vikki Ports (April 29, 2010 2:57 PM)  Physical Exam  General:  Well developed, well nourished, in no acute distress. Head:  normocephalic and atraumatic Eyes:  PERRLA/EOM intact; conjunctiva and lids normal. Lungs:  Clear bilaterally to auscultation and percussion. Heart:  PMI non displaced.  Normal S1 and S2.  No mumur. Extremities:  No clubbing or cyanosis. Neurologic:  Alert and oriented x 3.   EKG  Procedure date:  04/29/2010  Findings:      NSR with frequent to occassional PVCs.  Impression & Recommendations:  Problem # 1:  CAD, NATIVE VESSEL (ICD-414.01)  stable at present time.  No angina at present. His updated medication list for this problem includes:    Aspirin Ec 325 Mg Tbec (Aspirin) .Marland Kitchen... Take one tablet by mouth daily    Nitrostat 0.4 Mg Subl (Nitroglycerin) .Marland Kitchen... 1 tablet under tongue at onset of chest pain;  you may repeat every 5 minutes for up to 3 doses.    Metoprolol Succinate 50 Mg Xr24h-tab (Metoprolol succinate) .Marland Kitchen... Take one tablet by mouth daily  Orders: EKG w/ Interpretation (93000)  Problem # 2:  HYPERLIPIDEMIA (ICD-272.4) CHecked at  Dr. Loyal Gambler office His updated medication list for this problem includes:    Crestor 40 Mg Tabs (Rosuvastatin calcium) .Marland Kitchen... Take one tablet by mouth daily.  Problem #  3:  HYPERTENSION (ICD-401.9) controlled. His updated medication list for this problem includes:    Aspirin Ec 325 Mg Tbec (Aspirin) .Marland Kitchen... Take one tablet by mouth daily    Metoprolol Succinate 50 Mg Xr24h-tab (Metoprolol succinate) .Marland Kitchen... Take one tablet by mouth daily  Problem # 4:  PREMATURE VENTRICULAR CONTRACTIONS (ICD-427.69) Had an event montior, and just one singlet PVC.  Currently stable, and remains on beta blockade.  Cath done in the last year, and see results.  Discussed with patient.  His updated medication list for this problem includes:    Aspirin Ec 325 Mg Tbec (Aspirin) .Marland Kitchen... Take one tablet by mouth daily    Nitrostat 0.4 Mg Subl (Nitroglycerin) .Marland Kitchen... 1 tablet under tongue at onset of chest pain; you may repeat every 5 minutes for up to 3 doses.    Metoprolol Succinate 50 Mg Xr24h-tab (Metoprolol succinate) .Marland Kitchen... Take one tablet by mouth daily  Patient Instructions: 1)  Your physician recommends that you continue on your current medications as directed. Please refer to the Current Medication list given to you today. 2)  Your physician wants you to follow-up in: 6 MONTHS.  You will receive a reminder letter in the mail two months in advance. If you don't receive a letter, please call our office to schedule the follow-up appointment. Prescriptions: NITROSTAT 0.4 MG SUBL (NITROGLYCERIN) 1 tablet under tongue at onset of chest pain; you may repeat every 5 minutes for up to 3 doses.  #25 x 2   Entered by:   Julieta Gutting, RN, BSN   Authorized by:   Ronaldo Miyamoto, MD, [facility]   Signed by:   Julieta Gutting, RN, BSN on 04/29/2010   Method used:   Electronically to        [facility]* (retail)       [address]       [place], [place]  [address]       Ph: [phone] or [phone]       Fax: [phone]   RxID:   [id number]

## 2010-09-18 NOTE — Progress Notes (Signed)
Summary: c/o heart racing for 5-6- days  Phone Note Call from Patient Call back at Home Phone [phone] Call back at [phone]- wife work #   Caller: Spouse- tina Reason for Call: Talk to Nurse Summary of Call: per pt wife calling. c/o heart racing at time for about 5-6- day now. no cp, sob, skipping beat about 30 seconds.  Initial call taken by: Lorne Skeens,  May 19, 2010 11:59 AM  Follow-up for Phone Call        The pt's wife called because she is concerned about the pt complaining of a fast heart rate.  I left a message for the pt to call back (cell [phone]). Julieta Gutting, RN, BSN  May 19, 2010 12:53 PM  I spoke with the pt and he is having a fast heart rate at least 4-5 times per day and this has been happening for one week.  The pt denies CP but does feel lightheaded with increased pulse. These episodes last from a few seconds to minutes. The pt is taking his Metoprolol as instructed.  The pt just wore a monitor a few months ago and this showed PVCs.  I instructed the pt to increase his Metoprolol 50mg  to one and one-half daily. I will forward this message to Dr Riley Kill for review.   Follow-up by: Julieta Gutting, RN, BSN,  May 19, 2010 1:09 PM  Additional Follow-up for Phone Call Additional follow up Details #1::        Spoke with Dr Riley Kill who has reviewed the above information.  He gave orders for the pt to be seen as an add on and possibly have an event monitor placed d/t pt's history to VT.  Pt to be scheduled as ordered Additional Follow-up by: Charolotte Capuchin, RN,  May 20, 2010 11:28 AM

## 2010-09-18 NOTE — Letter (Signed)
Summary: Appointment - Missed  [facility], Main Office  [address]   [place], [place] [address]   Phone: [phone]  Fax: [phone]     Dec 25, 2009 MRN: [id number]   Andre [name] [address] [place], [place]  [address]   Dear [name],  Our records indicate you missed your appointment on 11/29/2009 with Dr. Riley Kill. It is very important that we reach you to reschedule this appointment. We look forward to participating in your health care needs. Please contact us at the number listed above at your earliest convenience to reschedule this appointment.     Sincerely, Neurosurgeon Team LG

## 2010-09-18 NOTE — Miscellaneous (Signed)
Summary: New Rx  Clinical Lists Changes  Medications: Rx of METOPROLOL SUCCINATE 50 MG XR24H-TAB (METOPROLOL SUCCINATE) 1 1/2 tab by mouth once daily;  #45 x 6;  Signed;  Entered by: Julieta Gutting, RN, BSN;  Authorized by: Ronaldo Miyamoto, MD, [facility];  Method used: Electronically to [facility]*, [address] [place], [place], [place]  [address], Ph: [phone] or [phone], Fax: [phone]    Prescriptions: METOPROLOL SUCCINATE 50 MG XR24H-TAB (METOPROLOL SUCCINATE) 1 1/2 tab by mouth once daily  #45 x 6   Entered by:   Julieta Gutting, RN, BSN   Authorized by:   Ronaldo Miyamoto, MD, [facility]   Signed by:   Julieta Gutting, RN, BSN on 05/21/2010   Method used:   Electronically to        [facility] (retail)       [address]       [place], [place]  [address]       Ph: [phone] or [phone]       Fax: [phone]   RxID:   [id number]

## 2010-09-18 NOTE — Assessment & Plan Note (Signed)
Summary: 2wk f/u    Visit Type:  2 weeks follow up Primary Provider:  Regino Schultze  CC:  Patient is feeling better now. No more episodes of dizziness or palpitations.  History of Present Illness: Overall feeling better.  He is doing much better.  We discussed his work, symptoms, and findings.  Holter monitor findings were reviewed.  We discussed EP evaluation in past.    Current Medications (verified): 1)  Phenytoin Sodium Extended 100 Mg Caps (Phenytoin Sodium Extended) .... 2 Tablets Two Times A Day 2)  Prilosec 20 Mg Cpdr (Omeprazole) .... Two Times A Day 3)  Aspirin Ec 325 Mg Tbec (Aspirin) .... Take One Tablet By Mouth Daily 4)  Nitrostat 0.4 Mg Subl (Nitroglycerin) .Marland Kitchen.. 1 Tablet Under Tongue At Onset of Chest Pain; You May Repeat Every 5 Minutes For Up To 3 Doses. 5)  Crestor 40 Mg Tabs (Rosuvastatin Calcium) .... Take One Tablet By Mouth Daily. 6)  Metoprolol Succinate 50 Mg Xr24h-Tab (Metoprolol Succinate) .Marland Kitchen.. 1 1/2 Tab By Mouth Once Daily  Allergies (verified): No Known Drug Allergies  Vital Signs:  Patient profile:   40 year old male Height:      68 inches Weight:      181.50 pounds BMI:     27.70 Pulse rate:   64 / minute Pulse rhythm:   regular Resp:     18 per minute BP sitting:   100 / 60  (left arm) Cuff size:   large  Vitals Entered By: Vikki Ports (June 04, 2010 4:26 PM)  Physical Exam  General:  Well developed, well nourished, in no acute distress. Head:  normocephalic and atraumatic Eyes:  PERRLA/EOM intact; conjunctiva and lids normal. Lungs:  Clear bilaterally to auscultation and percussion. Heart:  PMI non displaced.  Normal S1 and S2.  No murmur or rub.   Pulses:  pulses normal in all 4 extremities Extremities:  No clubbing or cyanosis. Neurologic:  Alert and oriented x 3.   Echocardiogram  Procedure date:  05/20/2010  Findings:      Study Conclusions            - Left ventricle: The cavity size was normal. Wall thickness was  normal. Systolic function was low normal to mildly decreased. The       estimated ejection fraction was in the range of 50% to 55%. Wall       motion was normal; there were no regional wall motion       abnormalities. Left ventricular diastolic function parameters were       normal.     - Aortic valve: There was no stenosis.     - Mitral valve: No significant regurgitation.     - Right ventricle: The cavity size was normal. Systolic function was       normal.     - Pulmonary arteries: No complete TR doppler jet so unable to       estimate PA systolic pressure.     - Inferior vena cava: The vessel was normal in size; the       respirophasic diameter changes were in the normal range (= 50%);       findings are consistent with normal central venous pressure.     Impressions:            - Normal LV size with low normal to mildly decreased systolic       function, EF 50-55%. Normal diastolic parameters. Normal RV size  and systolic function. No significant valvular dysfunction.  Event Monitor  Procedure date:  06/01/2010  Findings:      I reviewed this study.  No high grade arrythmias noted.  Average rate between 60-75.  No severe bradycardia.  Singlet PVCS mostly. 10-5 symptoms with rare couplet PVCs and singlet PVCs.  No VT.  See echo.   Impression & Recommendations:  Problem # 1:  PALPITATIONS (ICD-785.1) Patient has tachypalpitations with sometimes feeling funny.  To date, event recorders have demonstrated no high grade findings.  If he has an event, it would be important for him to report if he had any sustained findings.  I think the best option at this point would be to have him see EP;  questions would include implantable loop recorder, or EP study as options, vs. watchful observation.  With job and symptoms, would prefer he see EP to make suggestions regarding all of this.  His updated medication list for this problem includes:    Aspirin Ec 325 Mg Tbec (Aspirin) .Marland Kitchen... Take  one tablet by mouth daily    Nitrostat 0.4 Mg Subl (Nitroglycerin) .Marland Kitchen... 1 tablet under tongue at onset of chest pain; you may repeat every 5 minutes for up to 3 doses.    Metoprolol Succinate 50 Mg Xr24h-tab (Metoprolol succinate) .Marland Kitchen... 1 1/2 tab by mouth once daily  Orders: EP Referral (Cardiology EP Ref )  Problem # 2:  CAD, NATIVE VESSEL (ICD-414.01) See most recent cath data.  Fortunately, findings do not suggest high grade disease.  Also, echo suggests preserved LV overall.  Continue medical therapy at this point in time. His updated medication list for this problem includes:    Aspirin Ec 325 Mg Tbec (Aspirin) .Marland Kitchen... Take one tablet by mouth daily    Nitrostat 0.4 Mg Subl (Nitroglycerin) .Marland Kitchen... 1 tablet under tongue at onset of chest pain; you may repeat every 5 minutes for up to 3 doses.    Metoprolol Succinate 50 Mg Xr24h-tab (Metoprolol succinate) .Marland Kitchen... 1 1/2 tab by mouth once daily  Orders: EP Referral (Cardiology EP Ref )  Problem # 3:  HYPERTENSION (ICD-401.9) controlled, and on beta blocker for arrhythmia as well. His updated medication list for this problem includes:    Aspirin Ec 325 Mg Tbec (Aspirin) .Marland Kitchen... Take one tablet by mouth daily    Metoprolol Succinate 50 Mg Xr24h-tab (Metoprolol succinate) .Marland Kitchen... 1 1/2 tab by mouth once daily  Patient Instructions: 1)  Your physician recommends that you schedule a follow-up appointment in: 1 MONTH 2)  Your physician recommends that you continue on your current medications as directed. Please refer to the Current Medication list given to you today. 3)  You have been referred to an Electrophysiologist.

## 2010-09-18 NOTE — Progress Notes (Signed)
Summary: NEED NOTE TO GO BACK TO WORK  Phone Note Call from Patient Call back at Home Phone [phone]   Caller: Patient Summary of Call: PT NEEDS ANOTE FAX TO HIS JOB TO GO BACK TO WORK FAX TO [place] [phone] Initial call taken by: Judie Grieve,  August 28, 2009 10:15 AM  Follow-up for Phone Call        I spoke with the pt's wife and she said the pt was given a note to return to work on 07/29/09 with no restrictions when he left the hospital.  The pt did return to work on 07/29/09. The pt turned this note into his employer and they are "catching up on paperwork" and lost this note in the process.  The pt was told that if we did not send another note by 08/30/09 then he would have to remain out of work.  Return to work note faxed.    Follow-up by: Julieta Gutting, RN, BSN,  August 29, 2009 12:14 PM

## 2010-09-18 NOTE — Assessment & Plan Note (Signed)
Summary: ep eval SYNCOPE ONLY PVC'S SEEN ON HOLTER MONITOR/SL   Primary Provider:  [facility]gough  CC:  dizziness.  History of Present Illness: Andre [name] is referred today by Dr. Riley Kill for evaluation of palpitations and near syncope.  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  He notes that at time it feels like his heart is skipping and at other times it feels like sustained racing and like he might pass out.  He has not had frank syncope.  He has worn a cardiac monitor which demonstrated PVC's but no sustained tachycardia or atrial fib.  No chest pain though when his heart races he feels a fullness in the chest.  No other complaints today. His EF by echo and cath have been between 50-60%.    Current Medications (verified): 1)  Phenytoin Sodium Extended 100 Mg Caps (Phenytoin Sodium Extended) .... 2 Tablets Two Times A Day 2)  Prilosec 20 Mg Cpdr (Omeprazole) .... Two Times A Day 3)  Aspirin Ec 325 Mg Tbec (Aspirin) .... Take One Tablet By Mouth Daily 4)  Nitrostat 0.4 Mg Subl (Nitroglycerin) .Marland Kitchen.. 1 Tablet Under Tongue At Onset of Chest Pain; You May Repeat Every 5 Minutes For Up To 3 Doses. 5)  Crestor 40 Mg Tabs (Rosuvastatin Calcium) .... Take One Tablet By Mouth Daily. 6)  Metoprolol Succinate 50 Mg Xr24h-Tab (Metoprolol Succinate) .Marland Kitchen.. 1 1/2 Tab By Mouth Once Daily  Allergies: No Known Drug Allergies  Past History:  Past Medical History: Last updated: 05/20/2010 CAD, NATIVE VESSEL (ICD-414.01) TRIGGER FINGER, THUMB - BILATERAL (ICD-727.03) EMBOLISM, OB BLOOD-CLOT , UNSPECIFIED EPISODE (ICD-673.20) HEMORRHOIDS, INTERNAL (ICD-455.0) SEIZURE DISORDER (ICD-780.39) HYPERTENSION (ICD-401.9) HYPERLIPIDEMIA (ICD-272.4) GERD (ICD-530.81) CORONARY ARTERY DISEASE (ICD-414.00)S/P INFERIOR MI RX STENT RCA 07/18/01, RESIDUAL 70% CFX, 40-50% MID CFX, 40% LAD, 70%DX  EF47%. 53% MYOVIEW 7/06    Coronary artery disease GERD Hyperlipidemia Hypertension Seizure disorder  Past Surgical History: Last updated: 05/20/2010 Appendectomy Cholecystectomy DMI, treated with a bare-metal stent to the  right coronary in 2002.  Family History: Last updated: 10/04/2009  Positive, both his mother and father had coronary   artery disease with heart attack.   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  He does not smoke.      Review of Systems       All systems reviewed and negative except as noted in the HPI.  Vital Signs:  Patient profile:   40 year old male Height:      68 inches Weight:      183 pounds BMI:     27.93 Pulse rate:   66 / minute Pulse (ortho):   67 / minute Resp:     14 per minute BP sitting:   120 / 77  (right arm) BP standing:   122 / 78  Vitals Entered By: Kem Parkinson (June 11, 2010 3:58 PM)  Serial Vital Signs/Assessments:  Time      Position  BP       Pulse  Resp  Temp     By           Lying RA  120/77   66                    [facility]  Sitting   125/85   70                    [facility]           Standing  122/78   67                    [facility]   Physical Exam  General:  Well-developed well-nourished in no acute distress.  Skin is warm and dry.  HEENT is normal.  Neck is supple. No thyromegaly.  Chest is clear to auscultation with normal expansion.  Cardiovascular exam is regular rate and rhythm.  Abdominal exam not  distended. No masses palpated. Mild diffuse tenderness. Extremities show no edema. neuro grossly intact    Impression & Recommendations:  Problem # 1:  PREMATURE VENTRICULAR CONTRACTIONS (ICD-427.69) He has documented PVC's and may have VT or SVT. I have recommended EP study after discussing his situation with Dr. Riley Kill. If EP  study is negative then I would consider implantable loop recorder.  He will continue his meds.  An alternative would be to have the patient report to our [place] office for an ECG. His updated medication list for this problem includes:    Aspirin Ec 325 Mg Tbec (Aspirin) .Marland Kitchen... Take one tablet by mouth daily    Nitrostat 0.4 Mg Subl (Nitroglycerin) .Marland Kitchen... 1 tablet under tongue at onset of chest pain; you may repeat every 5 minutes for up to 3 doses.    Metoprolol Succinate 50 Mg Xr24h-tab (Metoprolol succinate) .Marland Kitchen... 1 1/2 tab by mouth once daily  Problem # 2:  CAD, NATIVE VESSEL (ICD-414.01) His symptoms appear to be well controlled.  Will recheck in several months. His updated medication list for this problem includes:    Aspirin Ec 325 Mg Tbec (Aspirin) .Marland Kitchen... Take one tablet by mouth daily    Nitrostat 0.4 Mg Subl (Nitroglycerin) .Marland Kitchen... 1 tablet under tongue at onset of chest pain; you may repeat every 5 minutes for up to 3 doses.    Metoprolol Succinate 50 Mg Xr24h-tab (Metoprolol succinate) .Marland Kitchen... 1 1/2 tab by mouth once daily  Problem # 3:  HYPERTENSION (ICD-401.9) HIs blood pressure appears to be well controlled. His updated medication list for this problem includes:    Aspirin Ec 325 Mg Tbec (Aspirin) .Marland Kitchen... Take one tablet by mouth daily    Metoprolol Succinate 50 Mg Xr24h-tab (Metoprolol succinate) .Marland Kitchen... 1 1/2 tab by mouth once daily

## 2010-09-18 NOTE — Progress Notes (Signed)
Summary: pt wants rx fro bp machine  Phone Note Call from Patient   Caller: Spouse tina [phone] Reason for Call: Talk to Nurse Summary of Call: pt's wife calling re getting an rx for a bp machine-can get a [place] [facility]  Initial call taken by: Glynda Jaeger,  May 19, 2010 2:49 PM  Follow-up for Phone Call        pt coming into office for an appointment today to see Dr Jens Som per Dr Riley Kill and can get the blood pressure monitor order then. Follow-up by: Charolotte Capuchin, RN,  May 20, 2010 11:49 AM

## 2010-09-18 NOTE — Letter (Signed)
Summary: Return To Work  [employer], Main Office  [address]   [place], [place] [address]   Phone: [phone]  Fax: [phone]    08/29/2009  TO: [name] IT MAY CONCERN   RE: Andre [name] [address] [id number]   The above named individual is under my medical care and may return to work on: December 13,2010 with no restrictions.  This note is dated 08/29/09 because the previous return to work note was misplaced by the patient's employer.   If you have any further questions or need additional information, please call.     Sincerely,     Herby Abraham, MD Julieta Gutting, RN, BSN

## 2010-09-23 ENCOUNTER — Ambulatory Visit (HOSPITAL_COMMUNITY): Payer: Self-pay

## 2010-09-24 ENCOUNTER — Ambulatory Visit (HOSPITAL_COMMUNITY): Payer: Self-pay | Admitting: Physical Therapy

## 2010-09-25 ENCOUNTER — Ambulatory Visit (HOSPITAL_COMMUNITY): Payer: Self-pay | Admitting: Physical Therapy

## 2010-11-11 ENCOUNTER — Other Ambulatory Visit: Payer: Self-pay | Admitting: *Deleted

## 2010-11-11 DIAGNOSIS — E785 Hyperlipidemia, unspecified: Secondary | ICD-10-CM

## 2010-11-11 MED ORDER — ROSUVASTATIN CALCIUM 40 MG PO TABS: 40.0000 mg | ORAL_TABLET | Freq: Every day | ORAL | Status: DC

## 2010-11-13 ENCOUNTER — Other Ambulatory Visit: Payer: Self-pay

## 2010-11-13 DIAGNOSIS — E785 Hyperlipidemia, unspecified: Secondary | ICD-10-CM

## 2010-11-13 MED ORDER — ROSUVASTATIN CALCIUM 40 MG PO TABS: 40.0000 mg | ORAL_TABLET | Freq: Every day | ORAL | Status: DC

## 2010-11-18 LAB — CBC
HCT: 43.5 % (ref 39.0–52.0)
HCT: 43.4 % (ref 39.0–52.0)
HCT: 44.5 % (ref 39.0–52.0)
Hemoglobin: 15.1 g/dL (ref 13.0–17.0)
Hemoglobin: 15.4 g/dL (ref 13.0–17.0)
MCHC: 34.8 g/dL (ref 30.0–36.0)
MCHC: 34.5 g/dL (ref 30.0–36.0)
MCV: 92.6 fL (ref 78.0–100.0)
MCV: 94 fL (ref 78.0–100.0)
MCV: 94.2 fL (ref 78.0–100.0)
Platelets: 161 10*3/uL (ref 150–400)
Platelets: 155 10*3/uL (ref 150–400)
Platelets: 167 10*3/uL (ref 150–400)
RBC: 4.69 MIL/uL (ref 4.22–5.81)
RBC: 4.72 MIL/uL (ref 4.22–5.81)
RDW: 13.4 % (ref 11.5–15.5)
RDW: 13.6 % (ref 11.5–15.5)
RDW: 13.7 % (ref 11.5–15.5)
WBC: 5.5 10*3/uL (ref 4.0–10.5)
WBC: 6.1 10*3/uL (ref 4.0–10.5)
WBC: 6.4 10*3/uL (ref 4.0–10.5)

## 2010-11-18 LAB — DIFFERENTIAL
Basophils Absolute: 0.1 10*3/uL (ref 0.0–0.1)
Basophils Relative: 1 % (ref 0–1)
Eosinophils Absolute: 0.1 10*3/uL (ref 0.0–0.7)
Eosinophils Relative: 2 % (ref 0–5)
Lymphocytes Relative: 37 % (ref 12–46)
Lymphs Abs: 2 10*3/uL (ref 0.7–4.0)
Monocytes Absolute: 0.4 10*3/uL (ref 0.1–1.0)
Monocytes Relative: 7 % (ref 3–12)
Neutro Abs: 2.9 10*3/uL (ref 1.7–7.7)
Neutrophils Relative %: 53 % (ref 43–77)

## 2010-11-18 LAB — POCT CARDIAC MARKERS
CKMB, poc: 1.3 ng/mL (ref 1.0–8.0)
Myoglobin, poc: 102 ng/mL (ref 12–200)
Troponin i, poc: <0.05 ng/mL (ref 0.00–0.09)

## 2010-11-18 LAB — BASIC METABOLIC PANEL
BUN: 8 mg/dL (ref 6–23)
BUN: 7 mg/dL (ref 6–23)
CO2: 23 mEq/L (ref 19–32)
CO2: 28 mEq/L (ref 19–32)
Calcium: 8.9 mg/dL (ref 8.4–10.5)
Calcium: 9 mg/dL (ref 8.4–10.5)
Chloride: 109 mEq/L (ref 96–112)
Chloride: 107 mEq/L (ref 96–112)
Creatinine, Ser: 0.98 mg/dL (ref 0.4–1.5)
Creatinine, Ser: 0.98 mg/dL (ref 0.4–1.5)
GFR calc Af Amer: >60 mL/min (ref >60)
GFR calc Af Amer: >60 mL/min (ref >60)
GFR calc non Af Amer: >60 mL/min (ref >60)
GFR calc non Af Amer: >60 mL/min (ref >60)
Glucose, Bld: 125 mg/dL — ABNORMAL HIGH (ref 70–99)
Glucose, Bld: 104 mg/dL — ABNORMAL HIGH (ref 70–99)
Potassium: 3.4 mEq/L — ABNORMAL LOW (ref 3.5–5.1)
Potassium: 4 mEq/L (ref 3.5–5.1)
Sodium: 139 mEq/L (ref 135–145)
Sodium: 141 mEq/L (ref 135–145)

## 2010-11-18 LAB — COMPREHENSIVE METABOLIC PANEL
ALT: 32 U/L (ref 0–53)
AST: 28 U/L (ref 0–37)
Albumin: 3.7 g/dL (ref 3.5–5.2)
Alkaline Phosphatase: 73 U/L (ref 39–117)
BUN: 6 mg/dL (ref 6–23)
CO2: 26 mEq/L (ref 19–32)
Calcium: 9.1 mg/dL (ref 8.4–10.5)
Chloride: 111 mEq/L (ref 96–112)
Creatinine, Ser: 0.95 mg/dL (ref 0.4–1.5)
GFR calc Af Amer: >60 mL/min (ref >60)
GFR calc non Af Amer: >60 mL/min (ref >60)
Glucose, Bld: 128 mg/dL — ABNORMAL HIGH (ref 70–99)
Potassium: 4.1 mEq/L (ref 3.5–5.1)
Sodium: 143 mEq/L (ref 135–145)
Total Bilirubin: 0.4 mg/dL (ref 0.3–1.2)
Total Protein: 6.8 g/dL (ref 6.0–8.3)

## 2010-11-18 LAB — URINALYSIS, ROUTINE W REFLEX MICROSCOPIC
Bilirubin Urine: NEGATIVE
Glucose, UA: NEGATIVE mg/dL
Hgb urine dipstick: NEGATIVE
Ketones, ur: NEGATIVE mg/dL
Nitrite: NEGATIVE
Protein, ur: NEGATIVE mg/dL
Specific Gravity, Urine: 1.005 (ref 1.005–1.030)
Urobilinogen, UA: 0.2 mg/dL (ref 0.0–1.0)
pH: 5 (ref 5.0–8.0)

## 2010-11-18 LAB — PROTIME-INR
INR: 1.02 (ref 0.00–1.49)
Prothrombin Time: 13.3 seconds (ref 11.6–15.2)

## 2010-11-18 LAB — APTT
aPTT: 28 seconds (ref 24–37)
aPTT: 29 seconds (ref 24–37)

## 2010-11-18 LAB — CARDIAC PANEL(CRET KIN+CKTOT+MB+TROPI)
CK, MB: 1.4 ng/mL (ref 0.3–4.0)
CK, MB: 1.7 ng/mL (ref 0.3–4.0)
Relative Index: 1.1 (ref 0.0–2.5)
Relative Index: 1.3 (ref 0.0–2.5)
Total CK: 127 U/L (ref 7–232)
Total CK: 136 U/L (ref 7–232)
Troponin I: 0.01 ng/mL (ref 0.00–0.06)
Troponin I: 0.01 ng/mL (ref 0.00–0.06)

## 2010-11-18 LAB — D-DIMER, QUANTITATIVE: D-Dimer, Quant: <0.22 ug/mL-FEU (ref 0.00–0.48)

## 2010-11-18 LAB — PHENYTOIN LEVEL, TOTAL: Phenytoin Lvl: 3.5 ug/mL — ABNORMAL LOW (ref 10.0–20.0)

## 2010-11-18 LAB — TSH: TSH: 1.408 u[IU]/mL (ref 0.350–4.500)

## 2010-12-30 NOTE — Letter (Signed)
May 14, 2008    Franchot Heidelberg, M.D.  [address]  [place], [place]  [address]   RE:  Andre [name], Andre [name]  MRN:  [id number]  /  DOB:  Aug 27, 1970   Dear Dr. Erby Pian:   I had the pleasure of seeing Andre [name] in the office today in a  followup visit.  To briefly summarize, this patient underwent  percutaneous stenting of the right coronary artery in 2002.  At that  time, he had well-preserved left ventricular function.  He had a  followup angiogram done about a week after his original presentation,  but his stent was patent.  His original presentation was an acute  inferior wall infarction.  He is not a smoker, his cholesterol has been  well controlled.  He is also followed by Dr. Ellison Carwin at  [facility] and is on chronic Dilantin therapy.   His medications include aspirin 81 mg daily, atenolol 25 mg daily,  Dilantin 100 mg 2 tablets p.o. b.i.d., Crestor 20 mg daily, and over-the-  counter Prilosec.   On physical today, the blood pressure is 130/80 with pulse of 68.  The  lung fields are clear and no carotid bruits.  Cardiac rhythm is regular  without a significant murmur.  The extremities reveal no edema.   The EKG reveals evidence of an age-indeterminate inferior wall  myocardial infarction.   Overall, this gentleman has remained stable since his original  presentation.  He has taken good care of himself.  It might be noted  that on examination, without sun exposure, he has a fairly  bronze color.  I have taken the liberty of ordering a serum ferritin  level.  Other than that, he needs to have a fasting lipid profile done,  and I have encouraged him to have this done through your office at [facility].  We will see him back in followup in 1 year's time.  Should he have any problem in the interview, please do not hesitate to  let me know.    Sincerely,      Arturo Morton. Riley Kill, MD, [facility]  Electronically Signed    TDS/MedQ   DD: 05/14/2008  DT: 05/15/2008  Job #: [id number]

## 2010-12-30 NOTE — Assessment & Plan Note (Signed)
[facility]                            CARDIOLOGY OFFICE NOTE   NAME:Bowron, KOSEI RHODES                       MRN:          [id number]  DATE:05/06/2007                            DOB:          [birth year]    Mr. Moquin is in for a followup.  He has recently gotten a CDL.  He no  longer smokes.  He got hurt at work.  In general, he has not been having  any major problems.  He rarely, if ever, gets a little bit of  lightheadedness.   CURRENT MEDICATIONS:  1. Aspirin 81 mg daily.  2. Atenolol 25 mg daily.  3. Dilantin 100 mg 2 b.i.d.  4. Crestor 20 mg daily.  5. Prilosec over-the-counter.   PHYSICAL:  The blood pressure is 110/80, pulse 67.  The lung fields are clear.  The cardiac rhythm is regular.  There is no murmur, rub, or gallop.  EXTREMITIES:  No edema.   EKG reveals normal sinus rhythm with age indeterminate inferior  infarction.  Overall, the patient is stable.  I plan to see him back in  1 year.  We need to find out if he has had some lab work.  Otherwise, he  is going to go get a lipid and liver profile at [facility].  Also, this  information can be faxed to [place].     Arturo Morton. Riley Kill, MD, [facility]  Electronically Signed    TDS/MedQ  DD: 05/06/2007  DT: 05/07/2007  Job #: [id number]

## 2010-12-30 NOTE — Assessment & Plan Note (Signed)
Andre [name]                       Andre [name]   NAME:Andre [name]                       MRN:          [id number]  DATE:09/26/2007                            DOB:          [birth year]    IDENTIFICATION:  Andre [name] is a 40 year old gentleman who is usually  followed by Andre [name].  He has a history of CAD status post MI in  2002 with PTCA stent to the RCA.  Last Myoview scan in 2006, EF was  minimally decreased to 53%. There was small inferobasilar infarct.   The patient called in today saying he was having some right shoulder,  right underarm, right arm pain.   In talking to him, he says he did not do any physical activity out of  the ordinary except Sunday at about 11:00 a.m. he was eating some ice  cream, and he began coughing, choking on the ice cream.  He had a  violent coughing jag for a couple minutes and it resolved.  It was after  this that the arm discomfort began. It occurred under his right arm,  down the arm.  The middle two fingers felt heavy.  He took a  nitroglycerin, and this helped maybe over the next 20-30 minutes but did  not completely go away.  He is also complaining of some right collar  bone pain. No shortness of breath.  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  No history of neck problems or DJD.   His symptoms now are very different from his MI symptoms in 2002 when he  had anterior chest discomfort or pressure (gas like).   Before yesterday, he had been feeling well.   CURRENT MEDICATIONS:  1. Dilantin 100 two tablets in the morning, two tablets in the      evening.  2. Atenolol 25.  3. Crestor 20.  4. Prilosec daily.  5. Aspirin 81.  6. Nitroglycerin p.r.n.   PHYSICAL EXAMINATION:  GENERAL:  The patient is in no distress.  VITAL SIGNS:  Blood pressure 128/78, pulse 72, weight 184.  LUNGS:  Clear.  CHEST:  Nontender.  CARDIAC:  Regular rate and rhythm, S1-S2.  No S3 or S4 or  significant  murmurs.  ABDOMEN:  Benign.  EXTREMITIES:  On palpation under the right arm, I bring on the  tenderness.  There is a touch point that then triggers up towards the  shoulder which is the pain he has been having. With moving his arm  around, I cannot bring on this pain, however.   A 12-lead EKG shows normal sinus rhythm, 72 beats per minute, inferior  wall MI, nonspecific T-wave changes unchanged from previous.   IMPRESSION:  1. Right shoulder pain appears to be musculoskeletal.  I think during      his coughing jag he pulled something. I told him he can try some      Motrin over the next couple of days, put some ice on his shoulder.      Otherwise, continue his current medications.  2. Coronary artery disease again appears to be stable.  Followup with      Andre [name] in the fall.  3. Dyslipidemia.  Continue on the Crestor with followup as planned by      Andre [name].   If his symptoms change, we would be happy to see him, but I  think he  should call his primary doctor if it does not get better.     Andre [name], Andre [name]  Electronically Signed    PVR/MedQ  DD: 09/26/2007  DT: 09/27/2007  Job #: [id number]   cc:   Andre [name], Andre [name]  Andre Heidelberg, M.D.

## 2011-01-02 NOTE — Discharge Summary (Signed)
[place]  Patient:    Andre [name], Andre [name] Visit Number: [id number] MRN: [id number]          Service Type: EMS Location: ED Attending Physician:  Hilario Quarry Dictated by:   Lavella Hammock, P.A.-C. Admit Date:  07/28/2001 Discharge Date: 07/28/2001   CC:         Arturo Morton. Riley Kill, M.D. [facility]  Arna Snipe, M.D.   Discharge Summary  DATE OF BIRTH:  06-23-1971  PROCEDURES: 1. Cardiac catheterization. 2. Coronary arteriogram. 3. Left ventriculogram.  HOSPITAL COURSE:  Mr. Scarano is a 40 year old male who was discharged from the hospital after an MI with percutaneous intervention to his RCA on July 22, 2001.  On July 28, 2001, he went to [facility] for chest pain that was described as like his MI pain.  His pain was relieved with sublingual nitroglycerin, heparin, and aspirin.  He was transferred to [facility] to rule out MI and for further evaluation.  His enzymes were negative for MI, and he was taken to the catheterization lab on July 29, 2001, for catheterization.  The cardiac catheterization showed a normal left main, an LAD with a proximal 25%, mid 40% stenoses.  The second diagonal had 50% stenosis.  The circumflex had a long proximal 60% stenosis that was also in the ostium and a mid 50% stenosis.  The RCA was dominant and had proximal stent with a long in-stent 30% stenosis.  His EF was 60% with mild inferior basilar hypokinesis and akinesis.  It was felt that he had nonobstructive coronary artery disease, and it was felt that aggressive risk factor reduction was indicated.  The patient was continued on his home medications except for an increase in the Toprol dose from 12.5 to 25 mg p.o. q.d.  He tolerated this well.  The patient is considered stable for discharge pending completion of bed rest with a groin stable after he ambulates.  CONDITION UPON DISCHARGE:  Stable.  CONSULTATIONS:   None.  COMPLICATIONS:  None.  DISCHARGE DIAGNOSES:  1. Chest pain, no critical stenoses by catheterization this admission.  2. Status post myocardial infarction on July 18, 2001, with percutaneous     transluminal coronary angioplasty and stent x 2 to the right coronary     artery.  3. Residual coronary artery disease in the second diagonal, left anterior     descending artery, and circumflex as described.  4. Preserved left ventricular function.  5. Hemoptysis while in hospital on Integrilin and Plavix.  6. Nonsustained ventricular tachycardia in the setting of myocardial     infarction.  7. History of tobacco use.  8. Possible chronic obstructive pulmonary disease.  9. Hyperlipidemia. 10. History of seizure disorder. 11. Family history of premature coronary artery disease.  DISCHARGE INSTRUCTIONS:  His activity level is to include no driving, sexual, or strenuous activity for two days.  He is to return to work per M.D.  He is to stick to a low-fat diet.  He is to call the office for bleeding, swelling, or drainage at the catheterization site.  He is not to use tobacco.  He has a P.A. appointment for treadmill on December 23 for Dr. Riley Kill, and he is to keep that .  He is to follow up with Dr. Leona Carry as scheduled.  DISCHARGE MEDICATIONS:  1. Dilantin 200 mg b.i.d. on even days with 300 mg q.a.m. and 200 q.p.m. on     odd days.  2.  Toprol XL 25 mg q.d.  3. Nitroglycerin 0.4 mg p.r.n.  4. Plavix 75 mg q.d.  5. Coated aspirin 325 mg q.d.  6. Zocor 40 mg q.d.  7. Foltx q.d. Dictated by:   Lavella Hammock, P.A.-C. Attending Physician:  Hilario Quarry DD:  07/29/01 TD:  07/29/01 Job: [id number]

## 2011-01-02 NOTE — H&P (Signed)
NAME:  Andre, [name]                          ACCOUNT NO.:  [id number]   MEDICAL RECORD NO.:  [id number]                   PATIENT TYPE:  INP   LOCATION:  A210                                 FACILITY:  [facility]   PHYSICIAN:  Hanley Hays. Dechurch, M.D.           DATE OF BIRTH:  1971-06-02   DATE OF ADMISSION:  05/04/2002  DATE OF DISCHARGE:                                HISTORY & PHYSICAL   HISTORY OF PRESENT ILLNESS:  The patient is a 40 year old Caucasian male  followed by Dr. Riley Kill with a past medical history remarkable for coronary  artery disease status post acute MI in December 2002 who underwent emergent  catheterization and stenting x2 of the right coronary artery.  The patient  had recurrent chest pain approximately two weeks later and underwent  catheterization again which revealed patent stents and no other changes.  According to the patient he underwent an exercise stress test which revealed  no evidence of ischemia and has done reasonably well since that time.  He  was put on statins, Lopressor, and aspirin and Plavix at that time but these  have been subsequently discontinued because of the question of a drug  reaction.  The patient had noted about two or three weeks ago he noted a  sensation of transient shortness of breath with actually no pain.  This  morning he had an episode that lasted much longer than usual.  He actually  took  nitroglycerin.  According to his wife, he has had several prolonged  episodes in the last couple of weeks for which he has taken two to four  nitroglycerin.  Because of the recurrent nature the patient presented to the  emergency room.  The patient also notes shortness of breath that awakens him  at night; these always occur at rest.  He has had no really exertional  symptoms per se.  He has had no other chest pain, no diaphoresis, no nausea.  He has noted vomiting but this occurs when he eats too much.   REVIEW OF SYSTEMS:  Pertinent for  a 25-pound weight gain since December.  Reflux symptoms which he has had for a long time and now dysphagia with  solids and occasional regurgitation.  He also has a rash on his upper arms  which apparently was thought related possibly to his medications and his  Zocor and a beta blocker were discontinued.  He has recently been placed on  another medication which sounds like a beta blocker although I do not know  the identity of the medication.  The patient had been on Nexium in the past  which helped with his heartburn and reflux but he has not had any in months.  The patient has remained quite active, continuing to work.  He was a two-and-  a-half pack per day smoker but quit at the time of his MI.   MEDICATIONS:  1. Aspirin  325 mg q.d.  2. Dilantin 200 b.i.d. even days; 200/300 q.d. on odd days.  3. Unknown drug, ? beta blocker, ? Coreg - the patient is unsure.   ALLERGIES:  No known drug allergies.   OTHER REVIEW OF SYSTEMS:  Also pertinent for increased LFTs with Zocor;  hemoptysis during his hospitalization with the stenting, possibly related to  his Integrilin and Plavix.   PAST MEDICAL HISTORY:  1. Coronary artery disease as noted above, status post MI, December 2002;     status post angioplasty and stent to the RCA.  2. History of cholecystectomy, September 2002; colonoscopy with cecal polyp.  3. Seizure disorder since age 75.   FAMILY MEDICAL HISTORY:  Pertinent for mother died at age 45 with acute MI.  Father is living but had an MI at age 30.  He has one brother but his  whereabouts are unknown.   SOCIAL HISTORY:  The patient is employed with the [employer].  He is  married.  He has two stepchildren age 32 and 1.  Previously a two-and-a-  half pack per day smoker, no alcohol or drug abuse.   PHYSICAL EXAMINATION:  GENERAL:  Reveals a well-developed, well-nourished  gentleman, no apparent distress; premature gray.  VITAL SIGNS:  Blood pressure 129/72, pulse 84  and regular, respirations  unlabored, O2 saturation 98% on room air.  Neck is supple.  No JVD,  adenopathy, thyromegaly, or bruits.  Oropharynx is moist.  LUNGS:  Clear to auscultation though diminished anteriorly and posteriorly.  HEART:  Regular rate and rhythm.  No murmur, gallop, or rub.  ABDOMEN:  Obese, soft, nontender, with active bowel sounds.  EXTREMITIES:  Without clubbing, cyanosis, or edema.  NEUROLOGIC:  Grossly intact.   ASSESSMENT AND PLAN:  31. A 40 year old with known coronary artery disease presenting with     shortness of breath and suggestion and paroxysmal nocturnal dyspnea     versus possible reflux; on aspirin alone.  The patient's history dictates     that myocardial infarction needs to be ruled out.  Consider echo,     consider recatheterization - will defer to cardiology.  Begin Lovenox,     continue aspirin, and add PPI to his regimen given the reflux.  He has     heme negative stools here in the emergency room.  He has recently had a     colonoscopy.  I am going to proceed with the Protonix and monitor and     this was discussed with the patient.  He will also need reflux education     and weight loss at some point.  Once he is cleared by cardiology he is     going to need an EGD with dilatation of the esophagus, given his history     of dysphagia with solids and probable stricture.  2. Seizure disorder.  Consider his Dilantin as his Dilantin level is     pending.  3. Weight gain.  Check TSH although this may all be related to his tobacco     cessation and decreased activity.  4. History of increased liver functions on Zocor.  Will monitor.  Check     liver profile, which was not done during his initial assessment.  5. Rash, upper extremities.  Looks like a follicular-type rash.  Doubt drug     related.  To consider dermatology consult.  This patient needs to be on     statin therapy but again, this can be  continued as an outpatient. 6. Abnormal chest x-ray  with a history of air space disease, probably early     emphysema though he has quit smoking and functionally is not impaired.     No further workup at this time.  He has been evaluated by pulmonary in     the past.  Apparently, alpha-1 antitrypsin was performed which was     normal.   The plan was discussed with the patient and his wife who seem to have  reasonable understanding.                                               Hanley Hays Josefine Class, M.D.    FED/MEDQ  D:  05/04/2002  T:  05/05/2002  Job:  [id number]

## 2011-01-02 NOTE — Cardiovascular Report (Signed)
[employer]  Patient:    Andre [name], Andre [name] Visit Number: [id number] MRN: [id number]          Service Type: EMS Location: ED Attending Physician:  Hilario Quarry Dictated by:   Rollene Rotunda, M.D. [facility] Proc. Date: 07/29/01 Admit Date:  07/28/2001 Discharge Date: 07/28/2001   CC:         Arna Snipe, M.D.  Arturo Morton. Riley Kill, M.D. [facility]   Cardiac Catheterization  DATE OF BIRTH:  11-11-1970  PROCEDURE:  Left heart catheterization/coronary arteriography.  CARDIOLOGIST:  Rollene Rotunda, M.D. [facility]  INDICATION:  Evaluate patient with chest pain (786.51) and recent inferior infarct status post stenting of his right coronary artery.  PROCEDURAL NOTE:  Left heart catheterization was performed via the right femoral artery.  The artery was cannulated using anterior wall puncture.  A #6 French arterial sheath was inserted via the modified Seldinger technique. Preformed Judkins and a pigtail catheter were utilized.  The patient tolerated the procedure well and left the lab in stable condition.  RESULTS:  HEMODYNAMICS:  LV: 135/14, AO: 135/79.  CORONARIES:  The left main was normal.  Left anterior descending artery:  The LAD had proximal 25% stenosis.  There was mid 40% stenosis across the second diagonal.  The second diagonal had long 50% stenosis.  Circumflex:  The circumflex had a long proximal (ostial) 60 to 70% stenosis . There was mid 50% stenosis.  Right coronary artery:  The right coronary artery was dominant.  There was a proximal stent with in-stent luminal irregularities but no high-grade stenosis.  The remainder of the vessel was free of significant disease.  LEFT VENTRICULOGRAM:  A left ventriculogram was obtained in the RAO projection.  The EF was 60% with mild inferior and basilar hypokinesis to akinesis.  CONCLUSION: 1. Nonobstructive coronary artery disease. 2. Patent right coronary artery stent.  PLAN:  The  patient will continue with secondary risk factor modification.  He has a followup appointment scheduled with Dr. Riley Kill at which time there is a planned exercise treadmill test.  He can keep this appointment. Dictated by:   Rollene Rotunda, M.D. [facility] Attending Physician:  Hilario Quarry DD:  07/29/01 TD:  07/29/01 Job: [id number]

## 2011-01-02 NOTE — Op Note (Signed)
Andre [name], Andre [name]                ACCOUNT NO.:  [id number]   MEDICAL RECORD NO.:  [id number]          PATIENT TYPE:  AMB   LOCATION:  DAY                           FACILITY:  [facility]   PHYSICIAN:  R. Roetta Sessions, M.D. DATE OF BIRTH:  04-22-1971   DATE OF PROCEDURE:  07/22/2004  DATE OF DISCHARGE:                                 OPERATIVE REPORT   PROCEDURE:  Colonoscopy, ileoscopy, rectal biopsy.   INDICATION FOR PROCEDURE:  The patient is a 40 year old gentleman with  intermittent hematochezia.  Recently he has been evaluated for nonspecific  abdominal pain as well.  CT back on July 08, 2004, demonstrated several  minimally prominent right lower quadrant mesenteric lymph nodes, nothing  else to explain his symptoms.  He recently saw Dr. Leona Carry, and he tells  me that he was told he had a very small umbilical hernia.  Colonoscopy is  now being done to further evaluate hematochezia.  This approach has been  discussed with the patient at length.  Potential risks, benefits, and  alternatives have been reviewed, questions answered.  Please see my  handwritten H&P for more information.   PROCEDURE NOTE:  O2 saturation, blood pressure, pulse, and respiration were  monitored throughout the entire procedure.   CONSCIOUS SEDATION:  Versed 5 mg IV, Demerol 75 mg IV in divided doses.   INSTRUMENT USED:  Olympus video chip system.   FINDINGS:  Digital exam revealed no abnormalities.   Endoscopic findings:  Prep was good.   Rectum:  Examination of the rectal mucosa revealed slight granularity of the  very distal 2-3 cm of the rectal mucosa.  There were also internal  hemorrhoids.  The rectal vault was somewhat small.  I was unable to  retroflex, but for the same reason I was able to see the rectal mucosa  fairly well en face.   Colon:  The colonic mucosa was surveyed from the rectosigmoid junction  through the left, transverse, and right colon to the area of the appendiceal  orifice, ileocecal valve, and cecum.  These structures were well-seen and  photographed for the record.  From this level the scope was slowly  withdrawn.  All previously-mentioned mucosal surfaces were again seen.  The  terminal ileum was intubated to a good 20 cm.  The terminal ileal mucosa as  well as the colonic mucosa appeared entirely normal.  The patient tolerated  the procedure well, was reacted in endoscopy.   IMPRESSION:  1.  Internal hemorrhoids and minimal granularity of the distal rectum of      uncertain clinical significance, biopsied.  The remainder of the rectal      mucosa appeared normal.  2.  Normal colon.  3.  Normal terminal ileum.   RECOMMENDATIONS:  1.  Will give [name] hemorrhoid literature and treat him with topical anti-      inflammatory therapy, Anusol-HC suppositories one per rectum at bedtime      x10 days.  2.  Follow up on pathology.  3.  The patient has some borderline enlarged lymph nodes, right lower  quadrant, nonspecific but deserves no further evaluation at this time.  4.  Will make further recommendations once pathology is available for      review.     Otelia Sergeant   RMR/MEDQ  D:  07/22/2004  T:  07/22/2004  Job:  [id number]   cc:   Bernerd Limbo. Leona Carry, M.D.  [address]  [place]  [place] [address]  Fax: [phone]

## 2011-01-02 NOTE — Op Note (Signed)
NAME:  Andre [name], Andre [name]                          ACCOUNT NO.:  [id number]   MEDICAL RECORD NO.:  [id number]                   PATIENT TYPE:  AMB   LOCATION:  DAY                                  FACILITY:  [facility]   PHYSICIAN:  R. Roetta Sessions, M.D.              DATE OF BIRTH:  September 19, 1970   DATE OF PROCEDURE:  DATE OF DISCHARGE:                                 OPERATIVE REPORT   PROCEDURE:  Esophagogastroduodenoscopy with biopsy.   ENDOSCOPIST:  Gerrit Friends. Rourk, M.D.   INDICATIONS FOR PROCEDURE:  The patient is a 40 year old gentleman with  longstanding esophageal reflux symptoms who comes for EGD to rule out  complicated disease.  His reflux symptoms were controlled on Protonix 40 mg  orally daily.  This approach has been discussed with the patient.  The  potential risks, benefits, and alternatives have been reviewed; and  questions answered.  He is agreeable.  Please see my dictated H&P for more  information; ASA 2.   DESCRIPTION OF PROCEDURE:  O2 saturation, blood pressure, pulse and  respirations were monitored throughout the entire procedure.   CONSCIOUS SEDATION:  Versed 3 mg IV, Demerol 75 mg IV, and Cetacaine spray  for topical oropharyngeal anesthesia.   INSTRUMENT:  Olympus video chip gastroscope.   FINDINGS:  Examination of the tubular esophagus revealed no mucosal  abnormalities.  He had a somewhat undulated Z-line.  The mucosa otherwise  appeared normal.  EG junction was easily traversed.   STOMACH:  The gastric cavity was empty.  It insufflated well with air.  A  thorough examination of the gastric mucosa including a retroflex view of the  proximal stomach and esophagogastric junction demonstrated only a small  hiatal hernia.  The pylorus was patent and easily traversed.   DUODENUM:  The bulb and the second portion appeared normal.   THERAPY/DIAGNOSTIC MANEUVERS:  None.   The patient tolerated the procedure well and was reacted in endoscopy.   IMPRESSION:  1. Small hiatal hernia.  2. The remainder of the upper gastrointestinal tract appeared normal.   RECOMMENDATIONS:  1. Continue Protonix 40 mg orally daily.  2. He was on Zocor which bumped his LFTs and had a rash.  The rash has     persisted since being off Zocor.  Concerns that Zocor may not have had     anything to do with his LFT abnormalities.  We offered him a course of     Crestor but his insurance does not pay for it.  3. Will recheck his LFTs in 2 weeks and will decide about another trial of     Zocor in the near future.  [name], M.D.    RMR/MEDQ  D:  06/08/2002  T:  06/08/2002  Job:  [id number]   cc:   Bernerd Limbo. Leona Carry, M.D.   Arturo Morton. Riley Kill, M.D. [facility]   Scarlette Calico E. Dechurch, M.D.  [address]  [place]  [place] [address]  Fax: [phone]

## 2011-01-02 NOTE — Discharge Summary (Signed)
[place]. [facility]  Patient:    Andre [name], Andre [name] Visit Number: [id number] MRN: [id number]          Service Type: MED Location: 2000 2003 01 Attending Physician:  Learta Codding Dictated by:   Joellyn Rued, P.A.-C. Admit Date:  07/18/2001 Discharge Date: 07/22/2001   CC:         Arna Snipe, M.D.  Arturo Morton. Riley Kill, M.D. [facility]   Referring Physician Discharge [place]  DATE OF BIRTH:  1971/01/10  PRIMARY CARE PHYSICIAN:  Dr. Arna Snipe.  CARDIOLOGIST:  Dr. Arturo Morton. Stuckey.  SUMMARY OF HISTORY:  Andre [name] is a 40 year old white male who presented to [facility] Emergency Room complaining of chest discomfort for the preceding 16 to 18 hours.  After the initial occurrence, the symptoms resolved, however, returned, associated with nausea, vomiting and diaphoresis, thus his presentation.  EKG had shown evidence of inferior myocardial infarction and he continued to have symptoms despite nitroglycerin, heparin, morphine, metoprolol and Phenergan.  It is noted the initial episode of chest discomfort occurred while raking leaves and decreased with rest.  The subsequent chest pain occurred, awakening him from sleep.  His history is notable for recent open cholecystectomy in September of 2002, he has had a seizure disorder since age 19, tobacco use, early family history.  LABORATORY DATA:  Laboratory data at [facility]:  Initial CK was 78 with an MB of 1.9, troponin slightly elevated at 0.08.  Sodium was 136, potassium 3.5, BUN 5, creatinine 0.8, glucose 146.  H&H were 15.6 and 43.8, normal indices, platelets 257,000, WBC 14.3.  PT 13.8.  Dilantin 9.4, slightly low.  PTT 24. On transfer to [facility], second CK total was 3201 with an MB of 535.5 and a relative index of 16.7.  Subsequent CK-MB isoenzymes were declining.  Fasting lipids on the 3rd showed a cholesterol of 162, triglycerides 118, HDL 32, LDL 106.  Admission LFTs were within normal limits.   Prior to discharge, his last H&H on the 3rd were 12.6 and 35.5, normal indices, platelets 217,000, WBC 9.9. Sodium 137, potassium 4.1, BUN 7, creatinine 0.7.  Admission chest x-ray showed opacity in the right mid lung, right perihilar region most consistent with pneumonia.  CT of the chest showed bilateral upper lobe airspace disease superimposed on chronic bullous disease, no evidence of mass or adenopathy.  Repeat chest x-ray showed bilateral upper lobe airspace disease, right side greater than left; upper lobe bullous changes were also noted.  EKG at [facility] showed normal sinus rhythm, the development of inferior Q waves with persistent inferior ST segment elevation.  Post-procedure EKG showed normal sinus rhythm, inferior Q waves, ST segments were almost to baseline.  Subsequent EKG shows evolving inferior myocardial infarction.  HOSPITAL COURSE:  Andre [name] was transferred emergently to [facility] and taken to the cardiac catheterization lab by Dr. Riley Kill.  According to his progress note, he had a 6% proximal RCA, 100% mid-RCA, 40-50% distal RCA, 40% PDA and a 40% lesion off the PL branch; he also had a 40% proximal LAD, 40% mid-LAD, a 70% diagonal 2, 70% proximal ostial circumflex, 40-50% mid-circumflex. Ejection fraction was 47.9%.  No MR.  Severe hypokinesis at the inferobasilar segment.  Dr. Riley Kill performed angioplasty stenting with overlapping stents to the proximal and mid-RCA, restoring TIMI grade 3 flow, reducing the lesion from 100% to 0%.  Post procedure, he did have a 12-beat run of nonsustained ventricular tachycardia which was asymptomatic.  He  was maintained on Integrilin and Plavix.  On July 19, 2001, he had some episodes of hemoptysis.  CT was obtained and pulmonary saw him in consultation.  They recommended antibiotics for 10 days and will follow up as an outpatient. Cardiac rehab began participating in patients care on July 20, 2001 and assisted with  teaching and ambulation.  By the 5th, he had not had any further hemoptysis.  By the 6th, he was ambulating without difficulty. Catheterization site did show a soft hematoma without bruit and evolving ecchymosis and distal pulses were intact.  Dr. Madolyn Frieze. Crenshaw reviewed the chart and felt that he could be discharged to home.  DISCHARGE DIAGNOSES: 1. Acute inferior myocardial infarction, status post angioplasty stenting of    the right coronary artery x 2 as described above. 2. Residual coronary artery disease. 3. Tobacco use. 4. Probable chronic obstructive pulmonary disease. 5. Hyperlipidemia. 6. History of seizure disorder.  DISPOSITION:  He is discharged home.  DISCHARGE MEDICATIONS: 1. Plavix 75 mg q.d. for an undetermined amount of time. 2. Coated aspirin 325 mg q.d. 3. Augmentin 875 mg b.i.d. for 10 days. 4. Zocor 40 mg q.h.s. 5. Foltx q.d. 6. Toprol-XL 25 mg half a tablet q.d. 7. Sublingual nitroglycerin as needed. 8. Dilantin 200 mg b.i.d. on even days; 300 mg q.a.m., 200 mg q.p.m. on odd    days.  ACTIVITY:  He was advised no lifting, driving, sexual activity or heavy exertion or working until seen by the physician.  DIET:  Maintain low-salt/-fat/-cholesterol diet.  SPECIAL DISCHARGE INSTRUCTIONS:  If he has any problems with his catheterization site, he was asked to call.  He was advised no smoking or tobacco products.  FOLLOWUP:  Dr. Louretta Shorten office will call him with an appointment to be seen next week; he will also call Dr. Casimiro Needle B. Wert to arrange a followup appointment for evaluation of lung disease.  Dr. Jens Som notes Dr. Riley Kill should consider checking an alpha 1 antitrypsin deficiency.  He will also need repeat fasting lipids and LFTs in approximately six weeks since we started him on Zocor.Dictated by:   Joellyn Rued, P.A.-C. Attending Physician:  Learta Codding DD:  07/22/01 TD:  07/22/01 Job: [id number]  [id number]

## 2011-01-02 NOTE — Discharge Summary (Signed)
NAME:  Andre [name], Andre [name]                          ACCOUNT NO.:  [id number]   MEDICAL RECORD NO.:  [id number]                   PATIENT TYPE:  INP   LOCATION:  A210                                 FACILITY:  [facility]   PHYSICIAN:  Hanley Hays. Dechurch, M.D.           DATE OF BIRTH:  1970-11-28   DATE OF ADMISSION:  05/04/2002  DATE OF DISCHARGE:  05/05/2002                                 DISCHARGE SUMMARY   DIAGNOSES:  1. Atypical chest pain.  2. Gastroesophageal reflux with probable stricture.  3. Coronary artery disease status post right coronary artery stent December     2002 with known residual disease.  4. Status post cholecystectomy.  5. Seizure disorder.  6. Dilantin toxicity.  7. History of increased liver function tests on Zocor.  8. Chronic rash, upper extremities.  9. Probable chronic obstructive pulmonary disease with history of tobacco     abuse.   CONDITION:  Improved.  Patient discharged to home.   MEDICATIONS:  1. Protonix 40 mg b.i.d. x2 weeks then 40 q.d.  2. Dilantin 200 mg b.i.d. after holding tonight's dose.  3. Aspirin 81 mg q.d.  4. Atenolol 25 mg q.d.  5. Nitroglycerin p.r.n.   ALLERGIES:  None known.   HOSPITAL COURSE:  The patient is a 40 year old gentleman with known coronary  artery disease who underwent right coronary artery stenting December 2002.  He was subsequently recatheterized approximately two weeks later because of  recurrent chest pain.  There was no change though he did have residual  disease.  The patient had a stress test after that which did not reveal any  evidence of reversible ischemia.  He has done reasonably well since that  time.  He presented to any emergency room complaining of a several-week  history of shortness of breath which would occur at night associated with  wheezing.  He also noted at rest he had an episode on the morning prior to  admission and presented to the emergency room.  He also experienced some  substernal  chest discomfort with that and it was relieved with  nitroglycerin, four.  The patient's enzymes were normal.  His EKG revealed  no changes.  The patient gave a history consistent with reflux with classic  nighttime symptoms.  He also described dysphagia with solids and  regurgitation as well as difficulty swallowing pills over the last several  months.  The patient was empirically treated with Protonix.  Again, from the  cardiac standpoint there was no remarkable findings.  He was seen by  cardiology.  It was felt that no further workup would be needed at this  time.  He does have a follow-up at the end of the month with Dr. Riley Kill,  his cardiologist.  The patient did not undergo endoscopy at this time as he  was stable, his stools were heme negative, his hemoglobin was normal.  It  was felt that  this could be done as an outpatient as well.  He also has a  follow up with Dr. Jena Gauss, gastroenterology - initially to follow up his LFTs  which were normal at this time, but also to further evaluate his GI  complaints.  At the time of discharge the patient is hemodynamically stable  with unremarkable physical exam.  The reader is referred to the H&P for  specifics.  The patient was counseled on reflux precautions.  It was felt  that he might benefit from referral to nutrition as an outpatient regarding  his diet.  This will be deferred to his primary physician and cardiologist.   Incidental finding during the hospital stay was a Dilantin level of 27.  He  did note some dizziness that he has noted recently.  He actually has had  problems with Dilantin toxicity in the past.  We will decrease his dose to  200 twice a day.  He has not had a seizure in nine years; therefore, it is  most likely he will be controlled fine if his levels are left between 10 and  20.  Again, this will be followed up as an outpatient at the time he sees  his cardiologist.                                                 Hanley Hays. Josefine Class, M.D.    FED/MEDQ  D:  05/05/2002  T:  05/08/2002  Job:  [id number]   cc:   Gerrit Friends. Rourk, M.D.   Arturo Morton. Riley Kill, M.D. [facility]   Bernerd Limbo. Leona Carry, M.D.

## 2011-01-02 NOTE — Cardiovascular Report (Signed)
[place]  Patient:    Andre [name], Andre [name] Visit Number: [id number] MRN: [id number]          Service Type: MED Location: [facility] 2931 01 Attending Physician:  Learta Codding Dictated by:   Arturo Morton. Riley Kill, M.D. [facility] Proc. Date: 07/18/01 Admit Date:  07/18/2001   CC:         Lewayne Bunting, M.D. [facility]  Lloyd Huger _______, M.D.  CV Laboratory  Nicoletta Dress. Colon Branch, M.D.   Cardiac Catheterization  INDICATIONS: The patient is a pleasant 40 year old white male, who has had a 4-5 day history of some exertional chest tightness. This became more severe yesterday and then awoke him from sleep in the early morning hours. He was pale, diaphoretic and passed out twice. His wife called the Emergency Medical [facility] and he was brought to the [facility]. The patients father is well known to me, and he had premature onset of acute myocardial infarction several years ago. The patient had mild ST elevation in the inferior leads and some lateral ST depression. This was not diagnostic, however, and because of this he was subsequently brought to [facility] for further and urgent coronary arteriography.  PROCEDURES: 1. Left heart catheterization. 2. Selective coronary arteriography. 3. Selective left ventriculography. 4. Percutaneous coronary stenting of the right coronary artery. 5. Temporary transvenous pacer implantation.  DESCRIPTION OF PROCEDURE: The patient was brought to the catheterization lab and prepped and draped in the usual fashion. Through an anterior puncture the right femoral artery was easily entered.  Views of the left and right coronary arteries were obtained in multiple angiographic projections and ventriculography was performed in the RAO projection. The study revealed inferobasal hypokinesis with reduced ejection fraction, and occlusion of the right coronary artery with faint collateralization of the distal right. Because of this,  preparations were made for percutaneous intervention. Because of bradycardia and some hypotension, a temporary transvenous pacer was passed to the inferior vena cava where it was kept in place and hooked up. Atropine 0.6 mg was administered. A JR4 6 [place] guiding catheter with side holes was then utilized to intubate the right coronary. Heparin and Integrilin had been given according to protocol with an ACT measured at 305 seconds.  The lesion was crossed with a 0.014 Hi-Torque Floppy wire with successful reperfusion. Following this, we passed a 2.5 mm Quantum Maverick balloon. TIMI-3 flow was reestablished. The patient ultimately stabilized. The lesion itself then was stented using a 2.5 x 20 mm stent. After placement of the stent there was also evidence of a moderate lesion above the stented site and a 2.75 x 16 Boston [employer] Express II stent was placed in the overlapping fashion with the first stent. The mid stent was then post-dilated using the 2.75 mm balloon up to about 12-14 atmospheres. There was marked improvement in the appearance of the artery.  There was evidence of about a 40-50% distal lesion, but the lumen appeared to be in excess of 2 mm and therefore was not dilated or stented. The patient had relief of pain. All catheters were subsequently removed, the femoral sheath sewn into place and the patient taken to the CCU in satisfactory condition.  HEMODYNAMIC DATA: 1. Central aortic pressure 103/75. 2. LV 98/15. 3. No aortic to left ventricular gradient.  ANGIOGRAPHIC DATA:  VENTRICULOGRAPHY: Ventriculography was performed in the RAO projection. There was inferobasal hypokinesis which was severe. No significant mitral regurgitation was noted. The ejection fraction was calculated at 47.9%.  The left main coronary artery was free of critical disease.  The LAD proper had about a 40% stenosis proximally. This appeared to be slightly less severe in the RAO and LAO  cranial views. There was a long area of segmental disease of about 40% throughout the mid vessel crossing the origin of the second diagonal.  The second diagonal itself had a 70% mid lesion. The distal LAD wrapped the apex.  The circumflex provided predominately one marginal branch. There was 70% segmental narrowing right at the ostium followed by a 40-50% area of plaquing in the mid vessel.  The right coronary artery demonstrated about a 60% area of narrowing near the junction of the proximal and mid vessel then was totally occluded in the mid artery. Following reperfusion there was a long segmental stenosis in the mid vessel, which was stented with a 20 mm stent to 0% residual luminal narrowing. The aforementioned 60% stenosis more proximally was also stented with a 2.75 x 16 mm stent in overlapping fashion with the first stent and was also reduced to 0%. Distally prior to the PDA was a eccentric focal stenosis of about 40-50% but the residual lumen appeared to be in excess of 2 mm. The PDA appeared after dilatation with nitroglycerin to have about a 40% stenosis at the ostium and the first large posterolateral branch also had about 40% narrowing. Distally there were two additional posterolateral branches and a sm final posterolateral branch as well. These were free of critical disease.  CONCLUSIONS: 1. Inferobasal hypokinesis with mild reduction in left ventricular function. 2. Total occlusion of the right coronary artery with successful    reestablishment of TIMI-3 flow. 3. Scattered disease of the left anterior descending and circumflex arteries    as described above.  DISPOSITION: The patient will be treated with aspirin and Plavix. I suspect we will lean towards aggressive medical therapy as the lesions in the left are  moderate but not critical. Discontinuation of smoking and cardiac rehabilitation will be recommended. Dictated by:   Arturo Morton Riley Kill, M.D. [facility] Attending  Physician:  Learta Codding DD:  07/18/01 TD:  07/18/01 Job: [id number]

## 2011-01-02 NOTE — Assessment & Plan Note (Signed)
[facility]                              CARDIOLOGY OFFICE NOTE   NAME:Letizia, ARIEON CORCORAN                       MRN:          [id number]  DATE:04/06/2006                            DOB:          11-Dec-1970    Mr. Walkowiak is in for a followup visit.  To briefly summarize, this gentleman  is doing extremely well.  He denies ongoing chest pain.  He has not resumed  smoking.  He underwent cardiac catheterization in 2002, at which time he had  overlying stents in the inferior segment.  His followup ejection fraction  was 48%.  He last underwent radionuclide imaging in 2006; at that time, his  EF was 53%.  There was a small inferobasal wall infarction.  He had lower  extremity Dopplers because of some calf pain during exercise, of which he  completed 12 minutes.  Followup Dopplers were normal.   PHYSICAL EXAMINATION:  VITAL SIGNS:  On examination today, the blood  pressure is 126/72.  The pulse is 64.  LUNGS:  The lung fields are clear.  CARDIAC:  Rhythm is regular.  No significant murmurs are appreciated.  Distal pulses are intact.   ACCESSORY CLINICAL DATA:  The electrocardiogram demonstrates normal sinus  rhythm.  There is an age-indeterminate inferior infarction.   IMPRESSION:  1. Coronary artery disease with prior myocardial infarction.  2. Hypercholesterolemia, on lipid-lowering therapy.  3. Former tobacco user.   DISPOSITION:  1. Followup exercise stress test in 1 year.  2. Return to clinic in 1 year.  3. Continue medical treatment.  4. Lipid and liver profile.                              Arturo Morton. Riley Kill, MD, [facility]    TDS/MedQ  DD:  04/06/2006  DT:  04/07/2006  Job #:  [id number]

## 2011-01-02 NOTE — Procedures (Signed)
[facility]  Patient:    Andre [name], Andre [name] Visit Number: [id number] MRN: [id number]          Service Type: MED Location: [facility] 2931 01 Attending Physician:  Learta Codding Dictated by:   Kari Baars, M.D. Proc. Date: 07/18/01 Admit Date:  07/18/2001                            EKG Interpretations  DATE:  July 18, 2001, at 0304 hours.  DESCRIPTION OF PROCEDURE:  The rhythm is a sinus rhythm with a rate in the 60s.  There was left ventricular hypertrophy, but there were ST elevations inferiorly with small Q-waves inferiorly and well as lateral T-wave changes that may indicate that this is a ischemia or infarction even.  IMPRESSION:  Abnormal electrocardiogram.Dictated by:   Kari Baars, M.D.  Attending Physician:  Learta Codding DD:  07/18/01 TD:  07/18/01 Job: [id number]

## 2011-05-15 ENCOUNTER — Other Ambulatory Visit: Payer: Self-pay | Admitting: Cardiology

## 2011-06-18 ENCOUNTER — Other Ambulatory Visit: Payer: Self-pay | Admitting: Cardiology

## 2011-07-20 ENCOUNTER — Other Ambulatory Visit: Payer: Self-pay | Admitting: Cardiology

## 2011-10-17 ENCOUNTER — Other Ambulatory Visit: Payer: Self-pay | Admitting: Cardiology

## 2011-10-20 NOTE — Telephone Encounter (Signed)
Patient needs to schedule an appt.

## 2012-01-25 ENCOUNTER — Other Ambulatory Visit: Payer: Self-pay | Admitting: Cardiology

## 2012-02-19 ENCOUNTER — Other Ambulatory Visit: Payer: Self-pay | Admitting: Cardiology

## 2012-02-19 NOTE — Telephone Encounter (Signed)
Pt needs appointment then refill can be made Fax Received. Refill Completed. Andre [name] (R.M.A)   

## 2012-05-23 ENCOUNTER — Other Ambulatory Visit: Payer: Self-pay | Admitting: Cardiology

## 2012-05-23 NOTE — Telephone Encounter (Signed)
..   Requested Prescriptions   Pending Prescriptions Disp Refills  . metoprolol succinate (TOPROL-XL) 50 MG 24 hr tablet [Pharmacy Med Name: METOPROLOL SUCCINATE 50MG  TER] 45 tablet 1    Sig: TAKE 1 & 1/2 TABLETS DAILY

## 2012-07-25 ENCOUNTER — Other Ambulatory Visit: Payer: Self-pay | Admitting: *Deleted

## 2015-02-17 ENCOUNTER — Encounter (HOSPITAL_COMMUNITY): Payer: Self-pay | Admitting: Emergency Medicine

## 2015-02-17 ENCOUNTER — Emergency Department (HOSPITAL_COMMUNITY)
Admission: EM | Admit: 2015-02-17 | Discharge: 2015-02-17 | Disposition: A | Discharge status: Home / Self Care | Payer: PRIVATE HEALTH INSURANCE | Attending: Emergency Medicine | Admitting: Emergency Medicine

## 2015-02-17 DIAGNOSIS — I10 Essential (primary) hypertension: Secondary | ICD-10-CM | POA: Diagnosis not present

## 2015-02-17 DIAGNOSIS — Y998 Other external cause status: Secondary | ICD-10-CM | POA: Insufficient documentation

## 2015-02-17 DIAGNOSIS — Y9389 Activity, other specified: Secondary | ICD-10-CM | POA: Diagnosis not present

## 2015-02-17 DIAGNOSIS — Z87891 Personal history of nicotine dependence: Secondary | ICD-10-CM | POA: Insufficient documentation

## 2015-02-17 DIAGNOSIS — Z79899 Other long term (current) drug therapy: Secondary | ICD-10-CM | POA: Insufficient documentation

## 2015-02-17 DIAGNOSIS — Z23 Encounter for immunization: Secondary | ICD-10-CM | POA: Insufficient documentation

## 2015-02-17 DIAGNOSIS — I252 Old myocardial infarction: Secondary | ICD-10-CM | POA: Diagnosis not present

## 2015-02-17 DIAGNOSIS — I2510 Atherosclerotic heart disease of native coronary artery without angina pectoris: Secondary | ICD-10-CM | POA: Diagnosis not present

## 2015-02-17 DIAGNOSIS — R42 Dizziness and giddiness: Secondary | ICD-10-CM | POA: Diagnosis not present

## 2015-02-17 DIAGNOSIS — Y9289 Other specified places as the place of occurrence of the external cause: Secondary | ICD-10-CM | POA: Insufficient documentation

## 2015-02-17 DIAGNOSIS — X58XXXA Exposure to other specified factors, initial encounter: Secondary | ICD-10-CM | POA: Diagnosis not present

## 2015-02-17 DIAGNOSIS — R55 Syncope and collapse: Secondary | ICD-10-CM | POA: Insufficient documentation

## 2015-02-17 DIAGNOSIS — S61011A Laceration without foreign body of right thumb without damage to nail, initial encounter: Secondary | ICD-10-CM | POA: Diagnosis not present

## 2015-02-17 HISTORY — DX: Acute myocardial infarction, unspecified: I21.9

## 2015-02-17 HISTORY — DX: Atherosclerotic heart disease of native coronary artery without angina pectoris: I25.10

## 2015-02-17 HISTORY — DX: Unspecified convulsions: R56.9

## 2015-02-17 HISTORY — DX: Essential (primary) hypertension: I10

## 2015-02-17 LAB — I-STAT CHEM 8, ED
BUN: 11 mg/dL (ref 6–20)
Calcium, Ion: 1.25 mmol/L — ABNORMAL HIGH (ref 1.12–1.23)
Chloride: 102 mmol/L (ref 101–111)
Creatinine, Ser: 1 mg/dL (ref 0.61–1.24)
Glucose, Bld: 72 mg/dL (ref 65–99)
HCT: 44 % (ref 39.0–52.0)
Hemoglobin: 15 g/dL (ref 13.0–17.0)
Potassium: 3.7 mmol/L (ref 3.5–5.1)
Sodium: 142 mmol/L (ref 135–145)
TCO2: 28 mmol/L (ref 0–100)

## 2015-02-17 LAB — I-STAT TROPONIN, ED: TROPONIN I, POC: 0 ng/mL (ref 0.00–0.08)

## 2015-02-17 LAB — TROPONIN I: Troponin I: <0.03 ng/mL (ref <0.031)

## 2015-02-17 MED ORDER — TETANUS-DIPHTH-ACELL PERTUSSIS 5-2.5-18.5 LF-MCG/0.5 IM SUSP
0.5000 mL | Freq: Once | INTRAMUSCULAR | Status: AC
  Administered 2015-02-17: 0.5 mL via INTRAMUSCULAR
  Filled 2015-02-17: qty 0.5

## 2015-02-17 MED ORDER — SODIUM CHLORIDE 0.9 % IV BOLUS (SEPSIS)
1000.0000 mL | Freq: Once | INTRAVENOUS | Status: AC
  Administered 2015-02-17: 1000 mL via INTRAVENOUS

## 2015-02-17 NOTE — ED Provider Notes (Signed)
CSN: [id number]     Arrival date & time 02/17/15  0418 History   First MD Initiated Contact with Patient 02/17/15 0425     Chief Complaint  Patient presents with  . Near Syncope     (Consider location/radiation/quality/duration/timing/severity/associated sxs/prior Treatment) HPI  This a 44 year old male with history of seizure, MI, and hypertension who presents with near syncope. Patient is a family member of another patient. After I was discussing Foley catheter placement with the family, patient developed lightheadedness and sweating. No chest pain. He denies room spinning dizziness. Reports that he feels he has been hydrated. On my evaluation, patient states that he feels much better after he sat down. No additional symptoms. History of MI at a young age, [age].  Patient denies recent illnesses.  Past Medical History  Diagnosis Date  . Seizures   . MI (myocardial infarction)   . Hypertension   . Coronary artery disease    Past Surgical History  Procedure Laterality Date  . Appendectomy    . Cholecystectomy     History reviewed. No pertinent family history. History  Substance Use Topics  . Smoking status: Former Games developermoker  . Smokeless tobacco: Not on file  . Alcohol Use: No    Review of Systems  Constitutional: Negative.  Negative for fever.  Respiratory: Negative.  Negative for chest tightness and shortness of breath.   Cardiovascular: Negative.  Negative for chest pain.  Gastrointestinal: Negative.  Negative for abdominal pain.  Genitourinary: Negative.  Negative for dysuria.  Neurological: Positive for dizziness and light-headedness. Negative for seizures, syncope, speech difficulty and headaches.  All other systems reviewed and are negative.     Allergies  Review of patient's allergies indicates no known allergies.  Home Medications   Prior to Admission medications   Medication Sig Start Date End Date Taking? Authorizing Provider  CRESTOR 40 MG tablet TAKE ONE (1)  TABLET EACH DAY 02/19/12  Yes Herby Abrahamhomas D Stuckey, MD  metoprolol (TOPROL-XL) 50 MG 24 hr tablet TAKE 1 & 1/2 TABLETS DAILY 07/20/11  Yes Herby Abrahamhomas D Stuckey, MD  metoprolol succinate (TOPROL-XL) 50 MG 24 hr tablet TAKE 1 & 1/2 TABLETS DAILY 05/23/12  Yes Herby Abrahamhomas D Stuckey, MD  nitroGLYCERIN (NITROSTAT) 0.4 MG SL tablet Place 1 tablet (0.4 mg total) under the tongue every 5 (five) minutes as needed for chest pain (up to 3 doses in 15 mins). 01/25/12   Herby Abrahamhomas D Stuckey, MD  rosuvastatin (CRESTOR) 40 MG tablet Take 1 tablet (40 mg total) by mouth daily. 11/11/10 11/11/11  Herby Abrahamhomas D Stuckey, MD  rosuvastatin (CRESTOR) 40 MG tablet Take 1 tablet (40 mg total) by mouth daily. 11/13/10 11/13/11  Herby Abrahamhomas D Stuckey, MD   BP 127/80 mmHg  Pulse 85  Temp(Src) 97.8 F (36.6 C) (Oral)  Resp 15  Ht 5\' 8"  (1.727 m)  Wt 150 lb (68.04 kg)  BMI 22.81 kg/m2  SpO2 99% Physical Exam  Constitutional: He is oriented to person, place, and time. He appears well-developed and well-nourished. No distress.  HENT:  Head: Normocephalic and atraumatic.  Eyes: EOM are normal. Pupils are equal, round, and reactive to light.  Cardiovascular: Normal rate and normal heart sounds.   No murmur heard. Frequent PVCs  Pulmonary/Chest: Effort normal and breath sounds normal. No respiratory distress. He has no wheezes.  Abdominal: Soft. Bowel sounds are normal. There is no tenderness. There is no rebound.  Musculoskeletal: He exhibits no edema.  Half centimeter laceration over the distal aspect of the right thumb,  scabbed over, no adjacent erythema, no obvious deformities  Neurological: He is alert and oriented to person, place, and time.  5 out of 5 strength in all 4 extremities, no dysmetria or ataxia  Skin: Skin is warm and dry.  Psychiatric: He has a normal mood and affect.  Nursing note and vitals reviewed.   ED Course  Procedures (including critical care time) Labs Review Labs Reviewed  I-STAT CHEM 8, ED - Abnormal; Notable for the  following:    Calcium, Ion 1.25 (*)    All other components within normal limits  TROPONIN I  I-STAT TROPOININ, ED    Imaging Review No results found.   EKG Interpretation   Date/Time:  Sunday February 17 2015 04:19:23 EDT Ventricular Rate:  75 PR Interval:  176 QRS Duration: 96 QT Interval:  370 QTC Calculation: 413 R Axis:   59 Text Interpretation:  Sinus rhythm Ventricular trigeminy Borderline T  abnormalities, inferior leads Frequent PVCs, similar to prior Confirmed by  HORTON  MD, COURTNEY ([id number]) on 02/17/2015 4:25:07 AM      MDM   Final diagnoses:  Near syncope  Thumb laceration, right, initial encounter    Patient presents with lightheadedness and dizziness. This is in the setting of discussing procedure regarding the patient's family member. He currently is asymptomatic. Had no chest pain or shortness of breath at that time. He he does drop his blood pressure upon standing from 120 systolic to 103. Patient was given fluids. EKG shows trigeminy. History of frequent PVCs. He takes metoprolol and has taken his medication today. Basic labwork and troponin negative. Suspect vasovagal versus dehydration causing the patient's symptoms. He continues to be asymptomatic and neurologically intact.  Of note, patient call me back to his room regarding remote injury on Friday. He sustained a laceration to his right thumb on equipment at work. Laceration is less than 0.5 cm. No signs of infection. No significant tenderness or deformity. Patient declined x-ray imaging. Patient's tetanus was updated.  After history, exam, and medical workup I feel the patient has been appropriately medically screened and is safe for discharge home. Pertinent diagnoses were discussed with the patient. Patient was given return precautions.     Shon Baton, MD 02/17/15 [phone]

## 2015-02-17 NOTE — ED Notes (Signed)
Pt was visiting a family member when he began to feel light headed, dizzy, and diaphoretic.

## 2015-02-17 NOTE — Discharge Instructions (Signed)
You were seen today for near syncope. This can happen for several reasons. This likely happened to you for combination of dehydration and a type of syncope, vasovagal syncope.  You should follow-up with her primary doctor if he had recurrence of symptoms. If he developed chest pain, shortness of breath, any new or worsening symptoms she should be reevaluated.  Near-Syncope Near-syncope (commonly known as near fainting) is sudden weakness, dizziness, or feeling like you might pass out. During an episode of near-syncope, you may also develop pale skin, have tunnel vision, or feel sick to your stomach (nauseous). Near-syncope may occur when getting up after sitting or while standing for a long time. It is caused by a sudden decrease in blood flow to the brain. This decrease can result from various causes or triggers, most of which are not serious. However, because near-syncope can sometimes be a sign of something serious, a medical evaluation is required. The specific cause is often not determined. HOME CARE INSTRUCTIONS  Monitor your condition for any changes. The following actions may help to alleviate any discomfort you are experiencing:  Have someone stay with you until you feel stable.  Lie down right away and prop your feet up if you start feeling like you might faint. Breathe deeply and steadily. Wait until all the symptoms have passed. Most of these episodes last only a few minutes. You may feel tired for several hours.   Drink enough fluids to keep your urine clear or pale yellow.   If you are taking blood pressure or heart medicine, get up slowly when seated or lying down. Take several minutes to sit and then stand. This can reduce dizziness.  Follow up with your health care provider as directed. SEEK IMMEDIATE MEDICAL CARE IF:   You have a severe headache.   You have unusual pain in the chest, abdomen, or back.   You are bleeding from the mouth or rectum, or you have black or tarry  stool.   You have an irregular or very fast heartbeat.   You have repeated fainting or have seizure-like jerking during an episode.   You faint when sitting or lying down.   You have confusion.   You have difficulty walking.   You have severe weakness.   You have vision problems.  MAKE SURE YOU:   Understand these instructions.  Will watch your condition.  Will get help right away if you are not doing well or get worse. Document Released: 08/03/2005 Document Revised: 08/08/2013 Document Reviewed: 01/06/2013 [facility] Patient Information 2015 [place], [place]. This information is not intended to replace advice given to you by your health care provider. Make sure you discuss any questions you have with your health care provider.

## 2016-05-05 ENCOUNTER — Ambulatory Visit (HOSPITAL_COMMUNITY)
Admission: RE | Admit: 2016-05-05 | Discharge: 2016-05-05 | Disposition: A | Discharge status: Home / Self Care | Payer: PRIVATE HEALTH INSURANCE | Source: Ambulatory Visit | Attending: Family Medicine | Admitting: Family Medicine

## 2016-05-05 ENCOUNTER — Other Ambulatory Visit (HOSPITAL_COMMUNITY): Payer: Self-pay | Admitting: Family Medicine

## 2016-05-05 DIAGNOSIS — N50819 Testicular pain, unspecified: Secondary | ICD-10-CM

## 2016-05-05 DIAGNOSIS — N433 Hydrocele, unspecified: Secondary | ICD-10-CM | POA: Diagnosis not present

## 2016-05-05 DIAGNOSIS — N451 Epididymitis: Secondary | ICD-10-CM | POA: Insufficient documentation

## 2016-12-04 ENCOUNTER — Ambulatory Visit: Payer: Self-pay

## 2016-12-04 ENCOUNTER — Other Ambulatory Visit: Payer: Self-pay | Admitting: Occupational Medicine

## 2016-12-04 DIAGNOSIS — M79642 Pain in left hand: Secondary | ICD-10-CM

## 2017-10-01 ENCOUNTER — Ambulatory Visit (INDEPENDENT_AMBULATORY_CARE_PROVIDER_SITE_OTHER): Payer: PRIVATE HEALTH INSURANCE | Admitting: Cardiovascular Disease

## 2017-10-01 ENCOUNTER — Encounter: Payer: Self-pay | Admitting: Cardiovascular Disease

## 2017-10-01 VITALS — BP 140/84 | HR 48 | Ht 68.0 in | Wt 165.0 lb

## 2017-10-01 DIAGNOSIS — I25118 Atherosclerotic heart disease of native coronary artery with other forms of angina pectoris: Secondary | ICD-10-CM | POA: Diagnosis not present

## 2017-10-01 DIAGNOSIS — I10 Essential (primary) hypertension: Secondary | ICD-10-CM | POA: Diagnosis not present

## 2017-10-01 DIAGNOSIS — I493 Ventricular premature depolarization: Secondary | ICD-10-CM | POA: Diagnosis not present

## 2017-10-01 DIAGNOSIS — E785 Hyperlipidemia, unspecified: Secondary | ICD-10-CM

## 2017-10-01 DIAGNOSIS — I252 Old myocardial infarction: Secondary | ICD-10-CM | POA: Diagnosis not present

## 2017-10-01 DIAGNOSIS — Z955 Presence of coronary angioplasty implant and graft: Secondary | ICD-10-CM | POA: Diagnosis not present

## 2017-10-01 DIAGNOSIS — R002 Palpitations: Secondary | ICD-10-CM | POA: Diagnosis not present

## 2017-10-01 NOTE — Patient Instructions (Addendum)
Your physician wants you to follow-up in: 2 months with Dr Koneswaran     Your physician has requested that you have an echocardiogram. Echocardiography is a painless test that uses sound waves to create images of your heart. It provides your doctor with information about the size and shape of your heart and how well your heart's chambers and valves are working. This procedure takes approximately one hour. There are no restrictions for this procedure.    Your physician recommends that you continue on your current medications as directed. Please refer to the Current Medication list given to you today.   If you need a refill on your cardiac medications before your next appointment, please call your pharmacy.      No lab work ordered today     Thank you for choosing [facility] !            

## 2017-10-01 NOTE — Progress Notes (Signed)
CARDIOLOGY CONSULT NOTE  Patient ID: Andre [name] MRN: [id number] DOB/AGE: May 06, 1971 47 y.o.  Admit date: (Not on file) Primary Physician: Elfredia Nevins, MD Referring Physician: Shawnie Dapper, PA-C.   Reason for Consultation: Palpitations  HPI: Andre [name] is a 47 y.o. male who is being seen today for the evaluation of palpitations at the request of Shawnie Dapper, PA-C.   I reviewed records from his PCP.  I personally reviewed an ECG performed on 08/25/17 which demonstrated sinus rhythm with frequent PVCs.  He takes metoprolol.  He was evaluated by Dr. Ladona Ridgel in October 2011.  At that time he had been experiencing palpitations and near syncope.  That he reportedly has a history of an inferior MI in 2002.  He has a history of an RCA stent.  He worked cardiac monitor which demonstrated PVCs but no sustained tachycardia or atrial fibrillation at that time.  He had normal left ventricular systolic function at that time. At that time, it appears an EP study was recommended.  It appears this was never pursued by the patient.  The last coronary angiogram I find is dated 07/18/2009 which demonstrated patent stents and moderate circumflex and LAD disease. There was a 40% LAD, a 40% circumflex and a 40% right coronary artery which has been treated medically.  He tells me about 1-1/2 months ago, he had about 10 days of palpitations.  He denies associated chest pain, shortness of breath, lightheadedness, and dizziness.  He said the last time he experienced palpitations like this was in 2010.  He does not drink coffee.  He does not like eating fruits, vegetables, or drinking water.  He had been drinking a 2 L bottle of Sun Drop on a daily basis.  His PCP told him to avoid caffeine.  He switched to drinking Sprite about a liter daily and since that time about 2 or 3 weeks ago, he has not had any palpitations since.  He stays very active and walks his dogs, does some jogging, does a  lot of walking with his wife.  He would like to begin lifting light weights.    No Known Allergies  Current Outpatient Medications  Medication Sig Dispense Refill  . aspirin 81 MG chewable tablet Chew 81 mg by mouth daily.    Marland Kitchen atorvastatin (LIPITOR) 40 MG tablet TK 1 T PO EACH DAY  1  . metoprolol (TOPROL-XL) 50 MG 24 hr tablet TAKE 1 & 1/2 TABLETS DAILY 45 tablet 1  . nitroGLYCERIN (NITROSTAT) 0.4 MG SL tablet Place 1 tablet (0.4 mg total) under the tongue every 5 (five) minutes as needed for chest pain (up to 3 doses in 15 mins). 25 tablet 3  . omeprazole (PRILOSEC) 20 MG capsule Take 20 mg by mouth daily.    . phenytoin (DILANTIN) 100 MG ER capsule TK 2 CS PO BID  1   No current facility-administered medications for this visit.     Past Medical History:  Diagnosis Date  . Coronary artery disease   . Hypertension   . MI (myocardial infarction) (HCC)   . Seizures (HCC)     Past Surgical History:  Procedure Laterality Date  . APPENDECTOMY    . BRAIN SURGERY     as an infant  . CHOLECYSTECTOMY      Social History   Socioeconomic History  . Marital status: Legally Separated    Spouse name: Not on file  . Number of children: Not  on file  . Years of education: Not on file  . Highest education level: Not on file  Social Needs  . Financial resource strain: Not on file  . Food insecurity - worry: Not on file  . Food insecurity - inability: Not on file  . Transportation needs - medical: Not on file  . Transportation needs - non-medical: Not on file  Occupational History  . Not on file  Tobacco Use  . Smoking status: Former Smoker    Start date: 10/01/1989    Last attempt to quit: 10/02/1999    Years since quitting: 18.0  . Smokeless tobacco: Current User    Types: Snuff  Substance and Sexual Activity  . Alcohol use: No  . Drug use: Not on file  . Sexual activity: Not on file  Other Topics Concern  . Not on file  Social History Narrative  . Not on file     No  family history of premature CAD in 1st degree relatives.  Current Meds  Medication Sig  . aspirin 81 MG chewable tablet Chew 81 mg by mouth daily.  Marland Kitchen atorvastatin (LIPITOR) 40 MG tablet TK 1 T PO EACH DAY  . metoprolol (TOPROL-XL) 50 MG 24 hr tablet TAKE 1 & 1/2 TABLETS DAILY  . nitroGLYCERIN (NITROSTAT) 0.4 MG SL tablet Place 1 tablet (0.4 mg total) under the tongue every 5 (five) minutes as needed for chest pain (up to 3 doses in 15 mins).  Marland Kitchen omeprazole (PRILOSEC) 20 MG capsule Take 20 mg by mouth daily.  . phenytoin (DILANTIN) 100 MG ER capsule TK 2 CS PO BID      Review of systems complete and found to be negative unless listed above in HPI    Physical exam Blood pressure 140/84, pulse (!) 48, height 5\' 8"  (1.727 m), weight 165 lb (74.8 kg), SpO2 98 %. General: NAD Neck: No JVD, no thyromegaly or thyroid nodule.  Lungs: Clear to auscultation bilaterally with normal respiratory effort. CV: Nondisplaced PMI. Regular rate and irregular rhythm with frequent premature contractions appreciated, normal S1/S2, no S3/S4, no murmur.  No peripheral edema.  No carotid bruit.    Abdomen: Soft, nontender, no distention.  Skin: Intact without lesions or rashes.  Neurologic: Alert and oriented x 3.  Psych: Normal affect. Extremities: No clubbing or cyanosis.  HEENT: Normal.   ECG: Most recent ECG reviewed.   Labs: Lab Results  Component Value Date/Time   K 3.7 02/17/2015 04:30 AM   BUN 11 02/17/2015 04:30 AM   CREATININE 1.00 02/17/2015 04:30 AM   ALT 32 07/17/2009 05:46 PM   TSH 1.35 05/20/2010 12:00 AM   HGB 15.0 02/17/2015 04:30 AM     Lipids: No results found for: LDLCALC, LDLDIRECT, CHOL, TRIG, HDL      ASSESSMENT AND PLAN:  1.  Palpitations with frequent PVCs: Symptomatically improved since switching to an alternative beverage.  He is currently on Toprol-XL 75 mg.  I will obtain an echocardiogram to evaluate cardiac structure and function.  As he is presently asymptomatic,  I do not feel event monitoring is indicated at this time.  2.  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  Currently on aspirin 81 mg, Toprol-XL 75 mg and Lipitor 40 mg.  I will obtain an echocardiogram to evaluate cardiac structure and function.  I will obtain a copy of labs from PCP.  3.  Hypertension: Blood pressure is mildly elevated and  not at goal which is less than 130/80.  I will monitor.  4.  Hyperlipidemia: Continue Lipitor 40 mg.  I will obtain a copy of lipids from his PCP.   Disposition: Follow up in 2 months  Signed: Prentice DockerSuresh Oaklyn Jakubek, M.D., F.A.C.C.  10/01/2017, 9:37 AM

## 2017-10-07 ENCOUNTER — Ambulatory Visit (HOSPITAL_COMMUNITY): Payer: PRIVATE HEALTH INSURANCE | Attending: Cardiovascular Disease

## 2017-11-04 ENCOUNTER — Encounter: Payer: Self-pay | Admitting: Cardiovascular Disease

## 2017-11-17 ENCOUNTER — Encounter: Payer: Self-pay | Admitting: Cardiovascular Disease

## 2017-12-03 ENCOUNTER — Ambulatory Visit: Payer: Self-pay | Admitting: Cardiovascular Disease

## 2018-04-17 ENCOUNTER — Encounter (HOSPITAL_COMMUNITY): Payer: Self-pay | Admitting: Emergency Medicine

## 2018-04-17 ENCOUNTER — Emergency Department (HOSPITAL_COMMUNITY): Payer: PRIVATE HEALTH INSURANCE

## 2018-04-17 ENCOUNTER — Inpatient Hospital Stay (HOSPITAL_COMMUNITY)
Admission: EM | Admit: 2018-04-17 | Discharge: 2018-04-20 | DRG: 177 | Disposition: A | Discharge status: Home / Self Care | Payer: PRIVATE HEALTH INSURANCE | Attending: Internal Medicine | Admitting: Internal Medicine

## 2018-04-17 DIAGNOSIS — J188 Other pneumonia, unspecified organism: Secondary | ICD-10-CM | POA: Diagnosis present

## 2018-04-17 DIAGNOSIS — Z79899 Other long term (current) drug therapy: Secondary | ICD-10-CM | POA: Diagnosis not present

## 2018-04-17 DIAGNOSIS — I4891 Unspecified atrial fibrillation: Secondary | ICD-10-CM | POA: Diagnosis present

## 2018-04-17 DIAGNOSIS — Z7982 Long term (current) use of aspirin: Secondary | ICD-10-CM

## 2018-04-17 DIAGNOSIS — J850 Gangrene and necrosis of lung: Secondary | ICD-10-CM

## 2018-04-17 DIAGNOSIS — K219 Gastro-esophageal reflux disease without esophagitis: Secondary | ICD-10-CM | POA: Diagnosis present

## 2018-04-17 DIAGNOSIS — Z720 Tobacco use: Secondary | ICD-10-CM

## 2018-04-17 DIAGNOSIS — R7401 Elevation of levels of liver transaminase levels: Secondary | ICD-10-CM

## 2018-04-17 DIAGNOSIS — J189 Pneumonia, unspecified organism: Secondary | ICD-10-CM

## 2018-04-17 DIAGNOSIS — E785 Hyperlipidemia, unspecified: Secondary | ICD-10-CM | POA: Diagnosis present

## 2018-04-17 DIAGNOSIS — G40909 Epilepsy, unspecified, not intractable, without status epilepticus: Secondary | ICD-10-CM | POA: Diagnosis present

## 2018-04-17 DIAGNOSIS — I2510 Atherosclerotic heart disease of native coronary artery without angina pectoris: Secondary | ICD-10-CM | POA: Diagnosis present

## 2018-04-17 DIAGNOSIS — I10 Essential (primary) hypertension: Secondary | ICD-10-CM | POA: Diagnosis present

## 2018-04-17 DIAGNOSIS — Z955 Presence of coronary angioplasty implant and graft: Secondary | ICD-10-CM

## 2018-04-17 DIAGNOSIS — R740 Nonspecific elevation of levels of transaminase and lactic acid dehydrogenase [LDH]: Secondary | ICD-10-CM | POA: Diagnosis present

## 2018-04-17 DIAGNOSIS — I252 Old myocardial infarction: Secondary | ICD-10-CM | POA: Diagnosis not present

## 2018-04-17 DIAGNOSIS — J9601 Acute respiratory failure with hypoxia: Secondary | ICD-10-CM

## 2018-04-17 DIAGNOSIS — J984 Other disorders of lung: Secondary | ICD-10-CM

## 2018-04-17 HISTORY — DX: Palpitations: R00.2

## 2018-04-17 HISTORY — DX: Gastro-esophageal reflux disease without esophagitis: K21.9

## 2018-04-17 LAB — CBC WITH DIFFERENTIAL/PLATELET
BASOS PCT: 0 %
Basophils Absolute: 0 10*3/uL (ref 0.0–0.1)
EOS ABS: 0.1 10*3/uL (ref 0.0–0.7)
EOS PCT: 1 %
HCT: 49.4 % (ref 39.0–52.0)
HEMOGLOBIN: 16.9 g/dL (ref 13.0–17.0)
Lymphocytes Relative: 18 %
Lymphs Abs: 2 10*3/uL (ref 0.7–4.0)
MCH: 32.6 pg (ref 26.0–34.0)
MCHC: 34.2 g/dL (ref 30.0–36.0)
MCV: 95.2 fL (ref 78.0–100.0)
MONOS PCT: 8 %
Monocytes Absolute: 0.9 10*3/uL (ref 0.1–1.0)
NEUTROS PCT: 73 %
Neutro Abs: 7.8 10*3/uL — ABNORMAL HIGH (ref 1.7–7.7)
PLATELETS: 303 10*3/uL (ref 150–400)
RBC: 5.19 MIL/uL (ref 4.22–5.81)
RDW: 13.1 % (ref 11.5–15.5)
WBC: 10.8 10*3/uL — ABNORMAL HIGH (ref 4.0–10.5)

## 2018-04-17 LAB — COMPREHENSIVE METABOLIC PANEL
ALK PHOS: 100 U/L (ref 38–126)
ALT: 79 U/L — AB (ref 0–44)
AST: 48 U/L — ABNORMAL HIGH (ref 15–41)
Albumin: 3.4 g/dL — ABNORMAL LOW (ref 3.5–5.0)
Anion gap: 10 (ref 5–15)
BUN: 19 mg/dL (ref 6–20)
CALCIUM: 9.1 mg/dL (ref 8.9–10.3)
CO2: 27 mmol/L (ref 22–32)
CREATININE: 1.04 mg/dL (ref 0.61–1.24)
Chloride: 99 mmol/L (ref 98–111)
Glucose, Bld: 93 mg/dL (ref 70–99)
Potassium: 3.5 mmol/L (ref 3.5–5.1)
SODIUM: 136 mmol/L (ref 135–145)
Total Bilirubin: 0.6 mg/dL (ref 0.3–1.2)
Total Protein: 8 g/dL (ref 6.5–8.1)

## 2018-04-17 LAB — TROPONIN I: Troponin I: <0.03 ng/mL (ref <0.03)

## 2018-04-17 LAB — D-DIMER, QUANTITATIVE (NOT AT ARMC): D DIMER QUANT: 0.59 ug{FEU}/mL — AB (ref 0.00–0.50)

## 2018-04-17 LAB — PHENYTOIN LEVEL, TOTAL: Phenytoin Lvl: 7.2 ug/mL — ABNORMAL LOW (ref 10.0–20.0)

## 2018-04-17 LAB — BRAIN NATRIURETIC PEPTIDE: B NATRIURETIC PEPTIDE 5: 45 pg/mL (ref 0.0–100.0)

## 2018-04-17 MED ORDER — ASPIRIN 81 MG PO CHEW
81.0000 mg | CHEWABLE_TABLET | Freq: Every day | ORAL | Status: DC
  Administered 2018-04-18 – 2018-04-20 (×3): 81 mg via ORAL
  Filled 2018-04-17 (×3): qty 1

## 2018-04-17 MED ORDER — PHENYTOIN SODIUM EXTENDED 100 MG PO CAPS
100.0000 mg | ORAL_CAPSULE | Freq: Two times a day (BID) | ORAL | Status: DC
  Administered 2018-04-18: 100 mg via ORAL
  Filled 2018-04-17 (×2): qty 1

## 2018-04-17 MED ORDER — SODIUM CHLORIDE 0.9 % IV SOLN
INTRAVENOUS | Status: DC
  Administered 2018-04-17 – 2018-04-20 (×5): via INTRAVENOUS

## 2018-04-17 MED ORDER — ENOXAPARIN SODIUM 40 MG/0.4ML ~~LOC~~ SOLN
40.0000 mg | SUBCUTANEOUS | Status: DC
  Administered 2018-04-17 – 2018-04-19 (×3): 40 mg via SUBCUTANEOUS
  Filled 2018-04-17 (×3): qty 0.4

## 2018-04-17 MED ORDER — PIPERACILLIN-TAZOBACTAM 3.375 G IVPB
3.3750 g | Freq: Once | INTRAVENOUS | Status: AC
  Administered 2018-04-18: 3.375 g via INTRAVENOUS
  Filled 2018-04-17: qty 50

## 2018-04-17 MED ORDER — ATORVASTATIN CALCIUM 40 MG PO TABS
40.0000 mg | ORAL_TABLET | Freq: Every day | ORAL | Status: DC
  Administered 2018-04-18 – 2018-04-19 (×2): 40 mg via ORAL
  Filled 2018-04-17 (×3): qty 1

## 2018-04-17 MED ORDER — METOPROLOL SUCCINATE ER 50 MG PO TB24
75.0000 mg | ORAL_TABLET | Freq: Every day | ORAL | Status: DC
  Administered 2018-04-18: 75 mg via ORAL
  Filled 2018-04-17: qty 1

## 2018-04-17 MED ORDER — FENTANYL CITRATE (PF) 100 MCG/2ML IJ SOLN
50.0000 ug | Freq: Once | INTRAMUSCULAR | Status: AC
  Administered 2018-04-17: 50 ug via INTRAVENOUS
  Filled 2018-04-17: qty 2

## 2018-04-17 MED ORDER — SODIUM CHLORIDE 0.9 % IV SOLN
3.0000 g | Freq: Once | INTRAVENOUS | Status: AC
  Administered 2018-04-17: 3 g via INTRAVENOUS
  Filled 2018-04-17: qty 3

## 2018-04-17 MED ORDER — PIPERACILLIN-TAZOBACTAM 3.375 G IVPB
3.3750 g | Freq: Three times a day (TID) | INTRAVENOUS | Status: DC
  Administered 2018-04-18 – 2018-04-20 (×7): 3.375 g via INTRAVENOUS
  Filled 2018-04-17 (×7): qty 50

## 2018-04-17 MED ORDER — MORPHINE SULFATE (PF) 2 MG/ML IV SOLN
2.0000 mg | INTRAVENOUS | Status: DC | PRN
  Administered 2018-04-17: 2 mg via INTRAVENOUS
  Filled 2018-04-17: qty 1

## 2018-04-17 MED ORDER — SODIUM CHLORIDE 0.9 % IV SOLN
500.0000 mg | INTRAVENOUS | Status: DC
  Administered 2018-04-17 – 2018-04-19 (×3): 500 mg via INTRAVENOUS
  Filled 2018-04-17 (×4): qty 500

## 2018-04-17 MED ORDER — IOPAMIDOL (ISOVUE-370) INJECTION 76%
75.0000 mL | Freq: Once | INTRAVENOUS | Status: AC | PRN
  Administered 2018-04-17: 75 mL via INTRAVENOUS

## 2018-04-17 MED ORDER — SODIUM CHLORIDE 0.9 % IV BOLUS
1000.0000 mL | Freq: Once | INTRAVENOUS | Status: AC
  Administered 2018-04-17: 1000 mL via INTRAVENOUS

## 2018-04-17 MED ORDER — PANTOPRAZOLE SODIUM 40 MG PO TBEC
40.0000 mg | DELAYED_RELEASE_TABLET | Freq: Every day | ORAL | Status: DC
  Administered 2018-04-18 – 2018-04-20 (×3): 40 mg via ORAL
  Filled 2018-04-17 (×3): qty 1

## 2018-04-17 NOTE — ED Notes (Signed)
Pt ambulated in [place]. Oxygen dropped to 87%. Maintained 97% the rest of the walking

## 2018-04-17 NOTE — ED Triage Notes (Signed)
Pt reports being seen at pcp last week and given abx for uri and steroid and symptoms getting worse.  Has been having bad pain in right lower abd and right side of back with coughing.  Nonproductive cough with shortness of breath and sensation of congestion.

## 2018-04-17 NOTE — Progress Notes (Signed)
Pharmacy Antibiotic Note  ROCKET WOLFE is a 47 y.o. male admitted on 04/17/2018 with aspiration pneumonia.  Pharmacy has been consulted for Zosyn dosing.  Plan: Zosyn 3.375g IV q8h (4 hour infusion).  Monitor labs, c/s, and patient improvement  Height: 5\' 8"  (172.7 cm) Weight: 160 lb (72.6 kg) IBW/kg (Calculated) : 68.4  Temp (24hrs), Avg:98.3 F (36.8 C), Min:98.3 F (36.8 C), Max:98.3 F (36.8 C)  Recent Labs  Lab 04/17/18 1519  WBC 10.8*  CREATININE 1.04    Estimated Creatinine Clearance: 85 mL/min (by C-G formula based on SCr of 1.04 mg/dL).    No Known Allergies  Antimicrobials this admission: Zosyn 9/1 >>  Azithromycin 9/1 >>   Dose adjustments this admission: N/A  Microbiology results: 9/1 BCx: pending  9/1 Sputum: pending     Thank you for allowing pharmacy to be a part of this patient's care.  Tad Moore 04/17/2018 8:41 PM

## 2018-04-17 NOTE — H&P (Signed)
History and Physical    Andre [name] XLE:[id number] DOB: 09/22/1970 DOA: 04/17/2018  PCP: Elfredia Nevins, MD Consultants:  Riley Kill - cardiology Patient coming from:  Home - lives with wife and 2 sons; NOK: Wife, [phone]  Chief Complaint: SOB  HPI: Andre [name] is a 47 y.o. male with medical history significant of CAD and HTN presenting with SOB.  Symptoms started last weekend.  He noticed difficulty breathing.  If he yawns, coughs, takes a deep breath, he has significant pleuritic CP.  He was diagnosed Monday with URI and given antibiotics and prednisone.  He completed all of the steroids and has a few antibiotics left.  SOB is with exertion or excessive coughing.  Cough is nonproductive (a bit of tan drainage at first and now rare clear sputum).  +fever to 102.  No LAD.  No wheezing.  No sore teeth.  Does not drink ETOH or smoke.  He occasionally uses oral tobacco, maybe a can a week.  His pain is in his right chest and by his shoulder blades.  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   ED Course:  URI, treated with antibiotics and steroids (completed).  Ongoing SOB, cough, chest pain.  Cavitary lesion on CXR, has a "severe necrotizing pneumonia."  Will treat with Unasyn, also with hypoxia.  Review of Systems: As per HPI; otherwise review of systems reviewed and negative.   Ambulatory Status:  Ambulates without assistance  Past Medical History:  Diagnosis Date  . Atrial fibrillation (HCC)   . Coronary artery disease 1999   stents x 2  . GERD (gastroesophageal reflux disease)   . Hypertension   . Seizures (HCC)    last seizure was in 1994    Past Surgical History:  Procedure Laterality Date  . APPENDECTOMY    . BRAIN SURGERY     as an infant  . CHOLECYSTECTOMY      Social History   Socioeconomic History  . Marital status: Legally Separated    Spouse name: Not on file  . Number of children: Not on file  . Years of education: Not on file  . Highest  education level: Not on file  Occupational History  . Not on file  Social Needs  . Financial resource strain: Not on file  . Food insecurity:    Worry: Not on file    Inability: Not on file  . Transportation needs:    Medical: Not on file    Non-medical: Not on file  Tobacco Use  . Smoking status: Former Smoker    Start date: 10/01/1989    Last attempt to quit: 07/1998    Years since quitting: 19.7  . Smokeless tobacco: Current User    Types: Snuff  Substance and Sexual Activity  . Alcohol use: No  . Drug use: Never  . Sexual activity: Not on file  Lifestyle  . Physical activity:    Days per week: Not on file    Minutes per session: Not on file  . Stress: Not on file  Relationships  . Social connections:    Talks on phone: Not on file    Gets together: Not on file    Attends religious service: Not on file    Active member of club or organization: Not on file    Attends meetings of clubs or organizations: Not on file    Relationship status: Not on file  . Intimate partner violence:    Fear of current  or ex partner: Not on file    Emotionally abused: Not on file    Physically abused: Not on file    Forced sexual activity: Not on file  Other Topics Concern  . Not on file  Social History Narrative  . Not on file    No Known Allergies  Family History  Problem Relation Age of Onset  . Heart attack Mother   . Heart attack Father   . Lung disease Neg Hx     Prior to Admission medications   Medication Sig Start Date End Date Taking? Authorizing Provider  aspirin 81 MG chewable tablet Chew 81 mg by mouth daily.    [provider]  atorvastatin (LIPITOR) 40 MG tablet TK 1 T PO EACH DAY 07/23/17   [provider]  metoprolol (TOPROL-XL) 50 MG 24 hr tablet TAKE 1 & 1/2 TABLETS DAILY 07/20/11   Herby Abraham, MD  nitroGLYCERIN (NITROSTAT) 0.4 MG SL tablet Place 1 tablet (0.4 mg total) under the tongue every 5 (five) minutes as needed for chest pain (up  to 3 doses in 15 mins). 01/25/12   Herby Abraham, MD  omeprazole (PRILOSEC) 20 MG capsule Take 20 mg by mouth daily.    [provider]  phenytoin (DILANTIN) 100 MG ER capsule TK 2 CS PO BID 07/18/17   [provider]    Physical Exam: Vitals:   04/17/18 1845 04/17/18 1900 04/17/18 1930 04/17/18 2000  BP:  (!) 147/70 (!) 148/87 (!) 144/85  Pulse: 81 86 78 82  Resp: 14 17 13 15   Temp:      TempSrc:      SpO2: 99% 100% 97% 98%  Weight:      Height:         General:  Appears calm and comfortable and is NAD Eyes:  PERRL, EOMI, normal lids, iris ENT:  grossly normal hearing, lips & tongue, mmm; full dentures in place and he refused to remove them for evaluation of his gums Neck:  no LAD, masses or thyromegaly Cardiovascular:  RRR, no m/r/g. No LE edema.  Respiratory:   CTA bilaterally with no wheezes/rales and only scattered rhonchi.  Normal respiratory effort.  On room air while at rest. Abdomen:  soft, NT, ND, NABS Back:   normal alignment, no CVAT Skin:  no rash or induration seen on limited exam Musculoskeletal:  grossly normal tone BUE/BLE, good ROM, no bony abnormality Psychiatric:  grossly normal mood and affect, speech fluent and appropriate, AOx3 Neurologic:  CN 2-12 grossly intact, moves all extremities in coordinated fashion, sensation intact    Radiological Exams on Admission: Dg Chest 2 View  Result Date: 04/17/2018 CLINICAL DATA:  SOB, PER ER NOTE, Pt reports being seen at PCP last week and given abx for uri and steroid and symptoms getting worse. Has been having bad pain in right lower abd and right side of back with coughing. EXAM: CHEST - 2 VIEW COMPARISON:  07/17/2009 FINDINGS: Heart size is normal. There is coarse reticular opacity in the RIGHT UPPER lobe. The appearance raises a question of cavitary lesion rib lesions. The LEFT lung is clear. No pulmonary edema. IMPRESSION: Coarse reticular opacities in the RIGHT UPPER lobe, consistent with  inflammatory/infectious process. Cavitary lesions are not excluded. Recommend further evaluation CT of the chest with intravenous contrast. Electronically Signed   By: Norva Pavlov M.D.   On: 04/17/2018 16:00   Ct Angio Chest Pe W And/or Wo Contrast  Result Date: 04/17/2018 CLINICAL  DATA:  47 year old male with history of right-sided back pain and coughing. Nonproductive cough. Shortness of breath. Chest congestion. EXAM: CT ANGIOGRAPHY CHEST WITH CONTRAST TECHNIQUE: Multidetector CT imaging of the chest was performed using the standard protocol during bolus administration of intravenous contrast. Multiplanar CT image reconstructions and MIPs were obtained to evaluate the vascular anatomy. CONTRAST:  75mL ISOVUE-370 IOPAMIDOL (ISOVUE-370) INJECTION 76% COMPARISON:  None. FINDINGS: Cardiovascular: No filling defects within the pulmonary arterial tree to suggest underlying pulmonary embolism. Heart size is normal. There is no significant pericardial fluid, thickening or pericardial calcification. There is aortic atherosclerosis, as well as atherosclerosis of the great vessels of the mediastinum and the coronary arteries, including calcified atherosclerotic plaque in the left main, left anterior descending, left circumflex and right coronary arteries. Mediastinum/Nodes: No lymphadenopathy noted in the mediastinal or hilar nodal stations. Prominent borderline enlarged right hilar lymph node measuring 10 mm in short axis. Esophagus is unremarkable in appearance. No axillary lymphadenopathy. Lungs/Pleura: In the right upper lobe near the apex there is a mass-like area of probable airspace consolidation measuring 5.9 x 4.5 x 3.2 cm (axial image 28 of series 9 and coronal image 83 of series 10), which demonstrates some internal areas of low attenuation, which likely reflect necrosis of the underlying lung tissue. This is in an area of extensive emphysematous changes. No other definite suspicious appearing pulmonary  nodules or masses. No pleural effusions. Diffuse bronchial wall thickening with severe centrilobular and paraseptal emphysema most evident throughout the lung apices. Upper Abdomen: Status post cholecystectomy. Musculoskeletal: There are no aggressive appearing lytic or blastic lesions noted in the visualized portions of the skeleton. Review of the MIP images confirms the above findings. IMPRESSION: 1. No evidence of pulmonary embolism. 2. Mass-like area of apparent airspace consolidation in the right upper lobe with what appears to be internal areas of necrosis, likely to reflect a severe necrotizing pneumonia. There is a prominent borderline enlarged right hilar lymph node associated with this. Repeat contrast enhanced chest CT is recommended in 1 month after trial of antimicrobial therapy to ensure resolution of these findings, as underlying neoplasm is not entirely excluded (but not favored at this time). 3. Diffuse bronchial wall thickening with severe centrilobular and paraseptal emphysema; imaging findings suggestive of underlying COPD. 4. Aortic atherosclerosis, in addition to left main and 3 vessel coronary artery disease. Please note that although the presence of coronary artery calcium documents the presence of coronary artery disease, the severity of this disease and any potential stenosis cannot be assessed on this non-gated CT examination. Assessment for potential risk factor modification, dietary therapy or pharmacologic therapy may be warranted, if clinically indicated. Aortic Atherosclerosis (ICD10-I70.0) and Emphysema (ICD10-J43.9). Electronically Signed   By: Trudie Reed M.D.   On: 04/17/2018 17:05    EKG: Independently reviewed.  NSR with rate 90; ventricular bigeminy; nonspecific ST changes with no evidence of acute ischemia   Labs on Admission: I have personally reviewed the available labs and imaging studies at the time of the admission.  Pertinent labs:   Albumin 3.4 AST  48/ALT 79 CMP otherwise WNL WBC 10.8 CBC otherwise WNL   Assessment/Plan Principal Problem:   Cavitary pneumonia Active Problems:   Dyslipidemia   Essential hypertension   Coronary atherosclerosis   Seizure disorder (HCC)   Cavitary PNA -Patient without significant known pulmonary history and without baseline symptoms presenting with 2 weeks of pleuritic CP and SOB -He failed outpatient PNA treatment - he has completed most of his antibiotics (doxycycline)  and 5 days of steroids without improvement -He was hypoxic to 87% with ambulation in the ER -Exam is generally unremarkable at rest -CXR showed a possible cavitary lesion in the RUL -CT showed a "mass-like area of apparent airspace consolidation in hte right upper lobe with what appears to be internal areas of necrosis, likely to reflect a severe necrotizing pneumonia" with a "prominent borderline enlarged right hilar lymph node" -He does not have any risk factors for cavitary PNA - no dentition at all (although gum disease is technically possible, and he was unwilling to remove his dentures to show me); does not use ETOH/drugs; does not have severe GERD; and has not had a seizure in many years. -I called and discussed the patient with Dr. Beatrix Fetters from pulmonology at [facility].  He reports that there are emphysematous changes on CT and so it causes him to wonder if this is a cavitary lesion vs. Emphysema with scattered infiltrates. -He thinks bronchoscopy would be low-yield.   -He recommends several weeks of antibiotic treatment (Zosyn/Azithro for now and then likely transition to Augmentin as an outpatient) followed by repeat CT in 4-6 weeks -If there is ongoing concern on the repeat CT, he will need a CT-guided biopsy. -Will admit for IV antibiotics therapy as well as prn O2 -Daily pulse ox -Monitor for improvement; if he is not improving, consider pulm consult either at [facility] or at [facility]  CAD -CT also showed CAD (left main and 3-vessel CAD)  as well as aortic artherosclerosis.   -In review, he was last seen by Dr. Purvis Sheffield in 2/19.  He was scheduled to have an Echo at that time and did not return for this. -I have strongly encouraged him to seek cardiology f/u after discharge.  HTN -Continue Toprol XL  HLD -Continue Lipitor -Consider increase in dose to 80 mg daily given apparent ASCVD on CT  Seizure d/o -He reports that his last seizure was in 1994 -Continue Dilantin -Will check Dilantin level    DVT prophylaxis:  Lovenox Code Status:  Full - confirmed with patient/family Family Communication: Wife was present throughout evaluation; his parents arrived later in the visit Disposition Plan:  Home once clinically improved Consults called: Pulmonology - by telephone only  Admission status: Admit - It is my clinical opinion that admission to INPATIENT is reasonable and necessary because of the expectation that this patient will require hospital care that crosses at least 2 midnights to treat this condition based on the medical complexity of the problems presented.  Given the aforementioned information, the predictability of an adverse outcome is felt to be significant.    Jonah Blue MD [facility]  If note is complete, please contact covering daytime or nighttime physician. [email or web address] Password [facility]  04/17/2018, 8:53 PM

## 2018-04-17 NOTE — ED Notes (Signed)
Family at bedside. 

## 2018-04-17 NOTE — ED Provider Notes (Signed)
[facility] EMERGENCY DEPARTMENT Provider Note   CSN: [id number] Arrival date & time: 04/17/18  1454     History   Chief Complaint Chief Complaint  Patient presents with  . Shortness of Breath    HPI Andre [name] is a 47 y.o. male.  HPI Patient presents with 6 days of cough productive of brown sputum, subjective fevers and chills, shortness of breath, right-sided pleuritic chest pain which radiates through to his back.  Patient states he was given a course of antibiotics and steroids with no improvement.  Unsure of the antibiotic.  He has been Robitussin.  Denies any lower extremity swelling or pain.  No recent extended travel or immobilization. Past Medical History:  Diagnosis Date  . Coronary artery disease   . Hypertension   . Seizures [facility])     Patient Active Problem List   Diagnosis Date Noted  . ABDOMINAL PAIN OTHER SPECIFIED SITE 05/20/2010  . PALPITATIONS 01/23/2010  . PREMATURE VENTRICULAR CONTRACTIONS 09/05/2009  . CAD, NATIVE VESSEL 10/04/2008  . TRIGGER FINGER 05/23/2008  . FINGER PAIN 05/23/2008  . PHARYNGITIS, ACUTE 11/15/2007  . ALLERGIC RHINITIS, SEASONAL 12/14/2006  . DIZZINESS 12/14/2006  . HYPERLIPIDEMIA 07/16/2006  . HYPERTENSION 07/16/2006  . CORONARY ARTERY DISEASE 07/16/2006  . HEMORRHOIDS, INTERNAL 07/16/2006  . GERD 07/16/2006  . EMBOLISM, OB BLOOD-CLOT , UNSPECIFIED EPISODE 07/16/2006  . SEIZURE DISORDER 07/16/2006    Past Surgical History:  Procedure Laterality Date  . APPENDECTOMY    . BRAIN SURGERY     as an infant  . CHOLECYSTECTOMY          Home Medications    Prior to Admission medications   Medication Sig Start Date End Date Taking? Authorizing Provider  aspirin 81 MG chewable tablet Chew 81 mg by mouth daily.    [provider]  atorvastatin (LIPITOR) 40 MG tablet TK 1 T PO EACH DAY 07/23/17   [provider]  metoprolol (TOPROL-XL) 50 MG 24 hr tablet TAKE 1 & 1/2 TABLETS DAILY 07/20/11   Herby Abraham, MD  nitroGLYCERIN (NITROSTAT) 0.4 MG SL tablet Place 1 tablet (0.4 mg total) under the tongue every 5 (five) minutes as needed for chest pain (up to 3 doses in 15 mins). 01/25/12   Herby Abraham, MD  omeprazole (PRILOSEC) 20 MG capsule Take 20 mg by mouth daily.    [provider]  phenytoin (DILANTIN) 100 MG ER capsule TK 2 CS PO BID 07/18/17   [provider]    Family History Family History  Problem Relation Age of Onset  . Heart attack Mother   . Heart attack Father     Social History Social History   Tobacco Use  . Smoking status: Former Smoker    Start date: 10/01/1989    Last attempt to quit: 10/02/1999    Years since quitting: 18.5  . Smokeless tobacco: Current User    Types: Snuff  Substance Use Topics  . Alcohol use: No  . Drug use: Not on file     Allergies   Patient has no known allergies.   Review of Systems Review of Systems  Constitutional: Positive for chills, fatigue and fever.  HENT: Negative for sore throat and trouble swallowing.   Respiratory: Positive for cough and shortness of breath.   Cardiovascular: Positive for chest pain. Negative for palpitations and leg swelling.  Gastrointestinal: Negative for abdominal pain, constipation, diarrhea, nausea and vomiting.  Musculoskeletal: Positive for back pain and myalgias. Negative for  neck pain.  Skin: Negative for rash and wound.  Neurological: Negative for dizziness, weakness, light-headedness, numbness and headaches.  All other systems reviewed and are negative.    Physical Exam Updated Vital Signs BP 122/84   Pulse 83   Temp 98.3 F (36.8 C) (Oral)   Resp 18   Ht 5\' 8"  (1.727 m)   Wt 72.6 kg   SpO2 98%   BMI 24.33 kg/m   Physical Exam  Constitutional: He is oriented to person, place, and time. He appears well-developed and well-nourished. No distress.  HENT:  Head: Normocephalic and atraumatic.  Mouth/Throat: Oropharynx is clear and moist. No oropharyngeal  exudate.  Eyes: Pupils are equal, round, and reactive to light. EOM are normal.  Neck: Normal range of motion. Neck supple. No JVD present.  Cardiovascular: Normal rate and regular rhythm. Exam reveals no gallop and no friction rub.  No murmur heard. Pulmonary/Chest: Effort normal and breath sounds normal. No stridor. No respiratory distress. He has no wheezes. He has no rales. He exhibits no tenderness.  Abdominal: Soft. Bowel sounds are normal. There is no tenderness. There is no rebound and no guarding.  Musculoskeletal: Normal range of motion. He exhibits no edema or tenderness.  No midline thoracic or lumbar tenderness.  No CVA tenderness.  No lower extremity swelling, asymmetry or tenderness.  Distal pulses are 2+.  Lymphadenopathy:    He has no cervical adenopathy.  Neurological: He is alert and oriented to person, place, and time.  Moves all extremities without focal deficit.  Sensation fully intact.  Skin: Skin is warm and dry. Capillary refill takes less than 2 seconds. No rash noted. He is not diaphoretic. No erythema.  Psychiatric: He has a normal mood and affect. His behavior is normal.  Nursing note and vitals reviewed.    ED Treatments / Results  Labs (all labs ordered are listed, but only abnormal results are displayed) Labs Reviewed  COMPREHENSIVE METABOLIC PANEL - Abnormal; Notable for the following components:      Result Value   Albumin 3.4 (*)    AST 48 (*)    ALT 79 (*)    All other components within normal limits  CBC WITH DIFFERENTIAL/PLATELET - Abnormal; Notable for the following components:   WBC 10.8 (*)    Neutro Abs 7.8 (*)    All other components within normal limits  D-DIMER, QUANTITATIVE (NOT AT [facility]) - Abnormal; Notable for the following components:   D-Dimer, Quant 0.59 (*)    All other components within normal limits  BRAIN NATRIURETIC PEPTIDE  TROPONIN I    EKG EKG Interpretation  Date/Time:  Sunday April 17 2018 15:06:52  EDT Ventricular Rate:  90 PR Interval:    QRS Duration: 92 QT Interval:  349 QTC Calculation: 427 R Axis:   51 Text Interpretation:  Sinus rhythm Ventricular bigeminy Inferior infarct, age indeterminate Lateral leads are also involved Confirmed by Loren Racer ([id number]) on 04/17/2018 6:06:27 PM   Radiology Dg Chest 2 View  Result Date: 04/17/2018 CLINICAL DATA:  SOB, PER ER NOTE, Pt reports being seen at PCP last week and given abx for uri and steroid and symptoms getting worse. Has been having bad pain in right lower abd and right side of back with coughing. EXAM: CHEST - 2 VIEW COMPARISON:  07/17/2009 FINDINGS: Heart size is normal. There is coarse reticular opacity in the RIGHT UPPER lobe. The appearance raises a question of cavitary lesion rib lesions. The LEFT lung is clear. No pulmonary  edema. IMPRESSION: Coarse reticular opacities in the RIGHT UPPER lobe, consistent with inflammatory/infectious process. Cavitary lesions are not excluded. Recommend further evaluation CT of the chest with intravenous contrast. Electronically Signed   By: Norva Pavlov M.D.   On: 04/17/2018 16:00   Ct Angio Chest Pe W And/or Wo Contrast  Result Date: 04/17/2018 CLINICAL DATA:  47 year old male with history of right-sided back pain and coughing. Nonproductive cough. Shortness of breath. Chest congestion. EXAM: CT ANGIOGRAPHY CHEST WITH CONTRAST TECHNIQUE: Multidetector CT imaging of the chest was performed using the standard protocol during bolus administration of intravenous contrast. Multiplanar CT image reconstructions and MIPs were obtained to evaluate the vascular anatomy. CONTRAST:  30mL ISOVUE-370 IOPAMIDOL (ISOVUE-370) INJECTION 76% COMPARISON:  None. FINDINGS: Cardiovascular: No filling defects within the pulmonary arterial tree to suggest underlying pulmonary embolism. Heart size is normal. There is no significant pericardial fluid, thickening or pericardial calcification. There is aortic  atherosclerosis, as well as atherosclerosis of the great vessels of the mediastinum and the coronary arteries, including calcified atherosclerotic plaque in the left main, left anterior descending, left circumflex and right coronary arteries. Mediastinum/Nodes: No lymphadenopathy noted in the mediastinal or hilar nodal stations. Prominent borderline enlarged right hilar lymph node measuring 10 mm in short axis. Esophagus is unremarkable in appearance. No axillary lymphadenopathy. Lungs/Pleura: In the right upper lobe near the apex there is a mass-like area of probable airspace consolidation measuring 5.9 x 4.5 x 3.2 cm (axial image 28 of series 9 and coronal image 83 of series 10), which demonstrates some internal areas of low attenuation, which likely reflect necrosis of the underlying lung tissue. This is in an area of extensive emphysematous changes. No other definite suspicious appearing pulmonary nodules or masses. No pleural effusions. Diffuse bronchial wall thickening with severe centrilobular and paraseptal emphysema most evident throughout the lung apices. Upper Abdomen: Status post cholecystectomy. Musculoskeletal: There are no aggressive appearing lytic or blastic lesions noted in the visualized portions of the skeleton. Review of the MIP images confirms the above findings. IMPRESSION: 1. No evidence of pulmonary embolism. 2. Mass-like area of apparent airspace consolidation in the right upper lobe with what appears to be internal areas of necrosis, likely to reflect a severe necrotizing pneumonia. There is a prominent borderline enlarged right hilar lymph node associated with this. Repeat contrast enhanced chest CT is recommended in 1 month after trial of antimicrobial therapy to ensure resolution of these findings, as underlying neoplasm is not entirely excluded (but not favored at this time). 3. Diffuse bronchial wall thickening with severe centrilobular and paraseptal emphysema; imaging findings  suggestive of underlying COPD. 4. Aortic atherosclerosis, in addition to left main and 3 vessel coronary artery disease. Please note that although the presence of coronary artery calcium documents the presence of coronary artery disease, the severity of this disease and any potential stenosis cannot be assessed on this non-gated CT examination. Assessment for potential risk factor modification, dietary therapy or pharmacologic therapy may be warranted, if clinically indicated. Aortic Atherosclerosis (ICD10-I70.0) and Emphysema (ICD10-J43.9). Electronically Signed   By: Trudie Reed M.D.   On: 04/17/2018 17:05    Procedures Procedures (including critical care time)  Medications Ordered in ED Medications  Ampicillin-Sulbactam (UNASYN) 3 g in sodium chloride 0.9 % 100 mL IVPB (3 g Intravenous New Bag/Given 04/17/18 1755)  sodium chloride 0.9 % bolus 1,000 mL (1,000 mLs Intravenous New Bag/Given 04/17/18 1755)  iopamidol (ISOVUE-370) 76 % injection 75 mL (75 mLs Intravenous Contrast Given 04/17/18 1626)  Initial Impression / Assessment and Plan / ED Course  I have reviewed the triage vital signs and the nursing notes.  Pertinent labs & imaging results that were available during my care of the patient were reviewed by me and considered in my medical decision making (see chart for details).     Patient with evidence of severe necrotizing pneumonia on CT scan.  Patient ambulates and oxygen saturation drops into the high 80s.  Start on Unasyn IV.  Discussed with hospitalist will see patient in the emergency department and admit.  Final Clinical Impressions(s) / ED Diagnoses   Final diagnoses:  Necrotizing pneumonia [facility])    ED Discharge Orders    None       Loren Racer, MD 04/17/18 1806

## 2018-04-18 ENCOUNTER — Inpatient Hospital Stay (HOSPITAL_COMMUNITY): Payer: PRIVATE HEALTH INSURANCE

## 2018-04-18 ENCOUNTER — Other Ambulatory Visit: Payer: Self-pay

## 2018-04-18 DIAGNOSIS — J9601 Acute respiratory failure with hypoxia: Secondary | ICD-10-CM

## 2018-04-18 DIAGNOSIS — R740 Nonspecific elevation of levels of transaminase and lactic acid dehydrogenase [LDH]: Secondary | ICD-10-CM

## 2018-04-18 DIAGNOSIS — I2510 Atherosclerotic heart disease of native coronary artery without angina pectoris: Secondary | ICD-10-CM

## 2018-04-18 DIAGNOSIS — R7401 Elevation of levels of liver transaminase levels: Secondary | ICD-10-CM

## 2018-04-18 DIAGNOSIS — G40909 Epilepsy, unspecified, not intractable, without status epilepticus: Secondary | ICD-10-CM

## 2018-04-18 DIAGNOSIS — J850 Gangrene and necrosis of lung: Principal | ICD-10-CM

## 2018-04-18 LAB — CBC WITH DIFFERENTIAL/PLATELET
BASOS ABS: 0 10*3/uL (ref 0.0–0.1)
BASOS PCT: 0 %
EOS PCT: 2 %
Eosinophils Absolute: 0.1 10*3/uL (ref 0.0–0.7)
HCT: 39.6 % (ref 39.0–52.0)
Hemoglobin: 13.1 g/dL (ref 13.0–17.0)
LYMPHS PCT: 25 %
Lymphs Abs: 2.2 10*3/uL (ref 0.7–4.0)
MCH: 31.6 pg (ref 26.0–34.0)
MCHC: 33.1 g/dL (ref 30.0–36.0)
MCV: 95.7 fL (ref 78.0–100.0)
MONO ABS: 0.8 10*3/uL (ref 0.1–1.0)
Monocytes Relative: 9 %
NEUTROS PCT: 64 %
Neutro Abs: 5.5 10*3/uL (ref 1.7–7.7)
Platelets: 241 10*3/uL (ref 150–400)
RBC: 4.14 MIL/uL — AB (ref 4.22–5.81)
RDW: 12.9 % (ref 11.5–15.5)
WBC: 8.6 10*3/uL (ref 4.0–10.5)

## 2018-04-18 LAB — BASIC METABOLIC PANEL
ANION GAP: 6 (ref 5–15)
BUN: 16 mg/dL (ref 6–20)
CHLORIDE: 105 mmol/L (ref 98–111)
CO2: 27 mmol/L (ref 22–32)
Calcium: 8.1 mg/dL — ABNORMAL LOW (ref 8.9–10.3)
Creatinine, Ser: 0.91 mg/dL (ref 0.61–1.24)
GFR calc Af Amer: >60 mL/min (ref >60)
GFR calc non Af Amer: >60 mL/min (ref >60)
Glucose, Bld: 93 mg/dL (ref 70–99)
POTASSIUM: 4.2 mmol/L (ref 3.5–5.1)
SODIUM: 138 mmol/L (ref 135–145)

## 2018-04-18 LAB — PROCALCITONIN: Procalcitonin: <0.1 ng/mL

## 2018-04-18 LAB — MRSA PCR SCREENING: MRSA BY PCR: NEGATIVE

## 2018-04-18 LAB — STREP PNEUMONIAE URINARY ANTIGEN: STREP PNEUMO URINARY ANTIGEN: NEGATIVE

## 2018-04-18 MED ORDER — METOPROLOL SUCCINATE ER 50 MG PO TB24
50.0000 mg | ORAL_TABLET | Freq: Every day | ORAL | Status: DC
  Administered 2018-04-19 – 2018-04-20 (×2): 50 mg via ORAL
  Filled 2018-04-18 (×2): qty 1

## 2018-04-18 MED ORDER — HYDROMORPHONE HCL 1 MG/ML IJ SOLN
1.0000 mg | INTRAMUSCULAR | Status: DC | PRN
  Administered 2018-04-18 – 2018-04-19 (×6): 1 mg via INTRAVENOUS
  Filled 2018-04-18 (×6): qty 1

## 2018-04-18 MED ORDER — FENTANYL CITRATE (PF) 100 MCG/2ML IJ SOLN: 50.0000 ug | INTRAMUSCULAR | Status: DC | PRN

## 2018-04-18 MED ORDER — TRAMADOL HCL 50 MG PO TABS
50.0000 mg | ORAL_TABLET | Freq: Four times a day (QID) | ORAL | Status: DC | PRN
  Administered 2018-04-18: 50 mg via ORAL
  Filled 2018-04-18: qty 1

## 2018-04-18 MED ORDER — PHENYTOIN SODIUM EXTENDED 100 MG PO CAPS
400.0000 mg | ORAL_CAPSULE | Freq: Every day | ORAL | Status: DC
  Administered 2018-04-18 – 2018-04-20 (×3): 400 mg via ORAL
  Filled 2018-04-18 (×3): qty 4

## 2018-04-18 MED ORDER — ONDANSETRON HCL 4 MG/2ML IJ SOLN: 4.0000 mg | Freq: Four times a day (QID) | INTRAMUSCULAR | Status: DC | PRN

## 2018-04-18 NOTE — Progress Notes (Signed)
PROGRESS NOTE  Andre [name] WCB:[id number] DOB: [birth year] DOA: 04/17/2018 PCP: Elfredia Nevins, MD  Brief History:  47 year old male presenting with a history of coronary artery disease, hypertension, and seizure disorder with 1 week history of pleuritic chest pain or shortness of breath.  The patient went to see his primary care provider on 04/11/2018.  He was diagnosed with a URI and started on doxycycline and prednisone.  He has finished his prednisone but continues on his doxycycline at the time of admission.  The patient also notes fevers and chills up to 102.0 F with his last fever on 04/13/2018.  On 04/17/2018, the patient noted worsening dyspnea on exertion with pleuritic chest pain and nonproductive cough.  As result, the patient presented to the hospital for further evaluation.  Upon presentation, CT angiogram of the chest was negative for pulmonary embolus but showed a right upper lobe masslike consolidation, 5.9 x 4.5 x 3.2 cm with internal areas of low-attenuation likely representing necrosis.  There was extensive dense emphysematous changes with diffuse bronchial wall thickening.  BMP was unremarkable.  WBC was 10.8 with hemoglobin 16.8.  Troponin was negative.  Dilantin level was 7.2. The patient denies any recent travels outside in [place].  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  He denies any recent hiking, camping, fishing type trips.  He has 2 dogs but denies any exotic pets.  He denies any recent travel outside the country.  He has not had any weight loss although his appetite has been poor this past week.  The patient has proximal 20-pack-year history of tobacco but stopped in 2001, but he continues to chew tobacco at this time.  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 -Continue aspirin -Continue metoprolol succinate -Continue statin  Essential hypertension -Continue metoprolol succinate  Hyperlipidemia -Continue statin  Transaminasemia -likely due to dilantin/statin -monitor closely -RUQ [place]  -hep b surface antigen -hep c antibody   Disposition Plan:   Home in 2-3 days  Family Communication:   Spouse updated at bedside  Consultants:  none  Code Status:  FULL   DVT Prophylaxis:  Lovenox   Procedures: As Listed in Progress Note Above  Antibiotics: None    Subjective: Patient continues to have pleuritic chest pain mild to moderate in nature in the right side.  He denies any nausea, vomiting or diarrhea, abdominal pain.  He has some mild dyspnea on exertion.  Denies any fevers, chills, headache, neck pain.  Objective: Vitals:   04/17/18 2000 04/17/18 2021 04/17/18 2236 04/18/18 0615  BP: (!) 144/85  122/60 95/63  Pulse: 82  85 81  Resp: 15  20 13   Temp:   98.6 F (37 C) 98.8 F (37.1 C)  TempSrc:   Oral Oral  SpO2: 98% 100% 100% 96%  Weight:      Height:        Intake/Output Summary (Last 24 hours) at 04/18/2018 0818 Last data filed at 04/18/2018 0400 Gross per 24 hour  Intake 1175 ml  Output -  Net 1175 ml   Weight change:  Exam:   General:  Pt is alert, follows commands appropriately, not in acute distress  HEENT: No  icterus, No thrush, No neck mass, Wall/AT  Cardiovascular: RRR, S1/S2, no rubs, no gallops  Respiratory: Fine bibasilar rales but no wheezing.  Good air movement.  Abdomen: Soft/+BS, non tender, non distended, no guarding  Extremities: No edema, No lymphangitis, No petechiae, No rashes, no synovitis   Data Reviewed: I have personally reviewed following labs and imaging studies Basic Metabolic Panel: Recent Labs  Lab  04/17/18 1519 04/18/18 0519  NA 136 138  K 3.5 4.2  CL 99 105  CO2 27 27  GLUCOSE 93 93  BUN 19 16  CREATININE 1.04 0.91  CALCIUM 9.1 8.1*   Liver Function Tests: Recent Labs  Lab 04/17/18 1519  AST 48*  ALT 79*  ALKPHOS 100  BILITOT 0.6  PROT 8.0  ALBUMIN 3.4*   No results for input(s): LIPASE, AMYLASE in the last 168 hours. No results for input(s): AMMONIA in the last 168 hours. Coagulation Profile: No results for input(s): INR, PROTIME in the last 168 hours. CBC: Recent Labs  Lab 04/17/18 1519 04/18/18 0519  WBC 10.8* 8.6  NEUTROABS 7.8* 5.5  HGB 16.9 13.1  HCT 49.4 39.6  MCV 95.2 95.7  PLT 303 241   Cardiac Enzymes: Recent Labs  Lab 04/17/18 1519  TROPONINI <0.03   BNP: Invalid input(s): POCBNP CBG: No results for input(s): GLUCAP in the last 168 hours. HbA1C: No results for input(s): HGBA1C in the last 72 hours. Urine analysis:    Component Value Date/Time   COLORURINE YELLOW 07/17/2009 1010   APPEARANCEUR CLEAR 07/17/2009 1010   LABSPEC 1.005 07/17/2009 1010   PHURINE 5.0 07/17/2009 1010   GLUCOSEU NEGATIVE 07/17/2009 1010   HGBUR NEGATIVE 07/17/2009 1010   BILIRUBINUR NEGATIVE 07/17/2009 1010   KETONESUR NEGATIVE 07/17/2009 1010   PROTEINUR NEGATIVE 07/17/2009 1010   UROBILINOGEN 0.2 07/17/2009 1010   NITRITE NEGATIVE 07/17/2009 1010   LEUKOCYTESUR  07/17/2009 1010    NEGATIVE MICROSCOPIC NOT DONE ON URINES WITH NEGATIVE PROTEIN, BLOOD, LEUKOCYTES, NITRITE, OR GLUCOSE <1000 mg/dL.   Sepsis Labs: @LABRCNTIP (procalcitonin:4,lacticidven:4) ) Recent Results (from the past 240 hour(s))  Culture, blood (routine x 2) Call MD if unable to obtain prior to antibiotics being given     Status: None (Preliminary result)   Collection Time: 04/17/18 10:46 PM  Result Value Ref Range Status   Specimen Description RIGHT ANTECUBITAL  Final   Special Requests   Final    BOTTLES DRAWN AEROBIC AND ANAEROBIC Blood Culture results may not be optimal due to  an excessive volume of blood received in culture bottles Performed at [facility], [address] [place], [place] [address]    Culture PENDING  Incomplete   Report Status PENDING  Incomplete  Culture, blood (routine x 2) Call MD if unable to obtain prior to antibiotics being given     Status: None (Preliminary result)   Collection Time: 04/17/18 11:00 PM  Result Value Ref Range Status   Specimen Description BLOOD LEFT HAND  Final   Special Requests   Final    BOTTLES DRAWN AEROBIC ONLY Blood Culture adequate volume Performed at [facility], [address] [place], [place] [address]    Culture PENDING  Incomplete   Report Status PENDING  Incomplete     Scheduled Meds: . aspirin  81 mg Oral Daily  . atorvastatin  40 mg Oral q1800  . enoxaparin (LOVENOX) injection  40 mg Subcutaneous Q24H  . metoprolol succinate  75 mg Oral Daily  . pantoprazole  40 mg Oral Daily  .  phenytoin  100 mg Oral BID   Continuous Infusions: . sodium chloride 75 mL/hr at 04/18/18 0400  . azithromycin Stopped (04/18/18 0000)  . piperacillin-tazobactam (ZOSYN)  IV 3.375 g (04/18/18 0627)    Procedures/Studies: Dg Chest 2 View  Result Date: 04/17/2018 CLINICAL DATA:  SOB, PER ER NOTE, Pt reports being seen at PCP last week and given abx for uri and steroid and symptoms getting worse. Has been having bad pain in right lower abd and right side of back with coughing. EXAM: CHEST - 2 VIEW COMPARISON:  07/17/2009 FINDINGS: Heart size is normal. There is coarse reticular opacity in the RIGHT UPPER lobe. The appearance raises a question of cavitary lesion rib lesions. The LEFT lung is clear. No pulmonary edema. IMPRESSION: Coarse reticular opacities in the RIGHT UPPER lobe, consistent with inflammatory/infectious process. Cavitary lesions are not excluded. Recommend further evaluation CT of the chest with intravenous contrast. Electronically Signed   By: Norva Pavlov M.D.   On: 04/17/2018 16:00   Ct Angio Chest  Pe W And/or Wo Contrast  Result Date: 04/17/2018 CLINICAL DATA:  47 year old male with history of right-sided back pain and coughing. Nonproductive cough. Shortness of breath. Chest congestion. EXAM: CT ANGIOGRAPHY CHEST WITH CONTRAST TECHNIQUE: Multidetector CT imaging of the chest was performed using the standard protocol during bolus administration of intravenous contrast. Multiplanar CT image reconstructions and MIPs were obtained to evaluate the vascular anatomy. CONTRAST:  75mL ISOVUE-370 IOPAMIDOL (ISOVUE-370) INJECTION 76% COMPARISON:  None. FINDINGS: Cardiovascular: No filling defects within the pulmonary arterial tree to suggest underlying pulmonary embolism. Heart size is normal. There is no significant pericardial fluid, thickening or pericardial calcification. There is aortic atherosclerosis, as well as atherosclerosis of the great vessels of the mediastinum and the coronary arteries, including calcified atherosclerotic plaque in the left main, left anterior descending, left circumflex and right coronary arteries. Mediastinum/Nodes: No lymphadenopathy noted in the mediastinal or hilar nodal stations. Prominent borderline enlarged right hilar lymph node measuring 10 mm in short axis. Esophagus is unremarkable in appearance. No axillary lymphadenopathy. Lungs/Pleura: In the right upper lobe near the apex there is a mass-like area of probable airspace consolidation measuring 5.9 x 4.5 x 3.2 cm (axial image 28 of series 9 and coronal image 83 of series 10), which demonstrates some internal areas of low attenuation, which likely reflect necrosis of the underlying lung tissue. This is in an area of extensive emphysematous changes. No other definite suspicious appearing pulmonary nodules or masses. No pleural effusions. Diffuse bronchial wall thickening with severe centrilobular and paraseptal emphysema most evident throughout the lung apices. Upper Abdomen: Status post cholecystectomy. Musculoskeletal: There  are no aggressive appearing lytic or blastic lesions noted in the visualized portions of the skeleton. Review of the MIP images confirms the above findings. IMPRESSION: 1. No evidence of pulmonary embolism. 2. Mass-like area of apparent airspace consolidation in the right upper lobe with what appears to be internal areas of necrosis, likely to reflect a severe necrotizing pneumonia. There is a prominent borderline enlarged right hilar lymph node associated with this. Repeat contrast enhanced chest CT is recommended in 1 month after trial of antimicrobial therapy to ensure resolution of these findings, as underlying neoplasm is not entirely excluded (but not favored at this time). 3. Diffuse bronchial wall thickening with severe centrilobular and paraseptal emphysema; imaging findings suggestive of underlying COPD. 4. Aortic atherosclerosis, in addition to left main and 3 vessel coronary artery disease. Please note that although the presence of coronary artery calcium  documents the presence of coronary artery disease, the severity of this disease and any potential stenosis cannot be assessed on this non-gated CT examination. Assessment for potential risk factor modification, dietary therapy or pharmacologic therapy may be warranted, if clinically indicated. Aortic Atherosclerosis (ICD10-I70.0) and Emphysema (ICD10-J43.9). Electronically Signed   By: Trudie Reed M.D.   On: 04/17/2018 17:05    Catarina Hartshorn, DO  [facility] Pager [phone]  If 7PM-7AM, please contact night-coverage [email or web address] Password [email or web address] 04/18/2018, 8:18 AM   LOS: 1 day

## 2018-04-19 DIAGNOSIS — E785 Hyperlipidemia, unspecified: Secondary | ICD-10-CM

## 2018-04-19 DIAGNOSIS — I10 Essential (primary) hypertension: Secondary | ICD-10-CM

## 2018-04-19 LAB — COMPREHENSIVE METABOLIC PANEL
ALT: 40 U/L (ref 0–44)
AST: 23 U/L (ref 15–41)
Albumin: 2.3 g/dL — ABNORMAL LOW (ref 3.5–5.0)
Alkaline Phosphatase: 63 U/L (ref 38–126)
Anion gap: 5 (ref 5–15)
BUN: 11 mg/dL (ref 6–20)
CO2: 29 mmol/L (ref 22–32)
Calcium: 8.2 mg/dL — ABNORMAL LOW (ref 8.9–10.3)
Chloride: 102 mmol/L (ref 98–111)
Creatinine, Ser: 0.9 mg/dL (ref 0.61–1.24)
GFR calc Af Amer: >60 mL/min (ref >60)
GFR calc non Af Amer: >60 mL/min (ref >60)
Glucose, Bld: 102 mg/dL — ABNORMAL HIGH (ref 70–99)
Potassium: 4.1 mmol/L (ref 3.5–5.1)
Sodium: 136 mmol/L (ref 135–145)
Total Bilirubin: 0.6 mg/dL (ref 0.3–1.2)
Total Protein: 6 g/dL — ABNORMAL LOW (ref 6.5–8.1)

## 2018-04-19 LAB — CBC
HCT: 39.1 % (ref 39.0–52.0)
Hemoglobin: 12.9 g/dL — ABNORMAL LOW (ref 13.0–17.0)
MCH: 31.9 pg (ref 26.0–34.0)
MCHC: 33 g/dL (ref 30.0–36.0)
MCV: 96.5 fL (ref 78.0–100.0)
Platelets: 234 10*3/uL (ref 150–400)
RBC: 4.05 MIL/uL — ABNORMAL LOW (ref 4.22–5.81)
RDW: 12.8 % (ref 11.5–15.5)
WBC: 8.3 10*3/uL (ref 4.0–10.5)

## 2018-04-19 LAB — ANGIOTENSIN CONVERTING ENZYME: Angiotensin-Converting Enzyme: 30 U/L (ref 14–82)

## 2018-04-19 LAB — HEPATITIS C ANTIBODY: HCV Ab: <0.1 s/co ratio (ref 0.0–0.9)

## 2018-04-19 LAB — HEPATITIS B SURFACE ANTIGEN: Hepatitis B Surface Ag: NEGATIVE

## 2018-04-19 LAB — HIV ANTIBODY (ROUTINE TESTING W REFLEX): HIV Screen 4th Generation wRfx: NONREACTIVE

## 2018-04-19 MED ORDER — OXYCODONE-ACETAMINOPHEN 5-325 MG PO TABS
1.0000 | ORAL_TABLET | Freq: Four times a day (QID) | ORAL | Status: DC | PRN
  Administered 2018-04-19 – 2018-04-20 (×3): 1 via ORAL
  Filled 2018-04-19 (×3): qty 1

## 2018-04-19 NOTE — Progress Notes (Signed)
Patient asked to leave the floor and walk down to the gift shop with wife. MD notified and stated it was fine. Will continue to monitor.

## 2018-04-19 NOTE — Progress Notes (Signed)
PROGRESS NOTE  Andre [name] QKM:[id number] DOB: Mar 06, 1971 DOA: 04/17/2018 PCP: Elfredia Nevins, MD  Brief History:  47 year old male presenting with a history of coronary artery disease, hypertension, and seizure disorder with 1 week history of pleuritic chest pain or shortness of breath.  The patient went to see his primary care provider on 04/11/2018.  He was diagnosed with a URI and started on doxycycline and prednisone.  He has finished his prednisone but continues on his doxycycline at the time of admission.  The patient also notes fevers and chills up to 102.0 F with his last fever on 04/13/2018.  On 04/17/2018, the patient noted worsening dyspnea on exertion with pleuritic chest pain and nonproductive cough.  As result, the patient presented to the hospital for further evaluation.  Upon presentation, CT angiogram of the chest was negative for pulmonary embolus but showed a right upper lobe masslike consolidation, 5.9 x 4.5 x 3.2 cm with internal areas of low-attenuation likely representing necrosis.  There was extensive dense emphysematous changes with diffuse bronchial wall thickening.  BMP was unremarkable.  WBC was 10.8 with hemoglobin 16.8.  Troponin was negative.  Dilantin level was 7.2. The patient denies any recent travels outside in [place].  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  He denies any recent hiking, camping, fishing type trips.  He has 2 dogs but denies any exotic pets.  He denies any recent travel outside the country.  He has not had any weight loss although his appetite has been poor this past week.  The patient has proximal 20-pack-year history of tobacco but stopped in 2001, but he continues to chew tobacco at this time.  Assessment/Plan: Acute respiratory failure with hypoxia -Secondary to pneumonia -Patient oxygen saturation 87% with ambulation  Necrotizing/cavitary pneumonia -Check HIV--neg -Check  ANA -Check ANCA -Check QuantiFERON -Check angiotensin-converting enzyme -Check procalcitonin <0.10 -Dr. 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 -Continue aspirin -Continue metoprolol succinate -Continue statin  Essential hypertension -Continue metoprolol succinate  Hyperlipidemia -Continue statin  Transaminasemia -likely due to dilantin/statin -monitor closely -RUQ us--neg -hep b surface antigen--neg -hep c antibody--neg   Disposition Plan:   Home 04/20/18 if stable Family Communication:   Spouse updated at bedside  Consultants:  none  Code Status:  FULL   DVT Prophylaxis: [place] Lovenox   Procedures: As Listed in Progress Note Above  Antibiotics: Zosyn 9/1>>> azithro 9/1>>>   Subjective: Pt state chest pain is improving, but still has dyspnea on exertion.  Denies cp, f/c, hemoptysis, n/v/d, abd pain.  Objective: Vitals:   04/18/18 1932 04/18/18 2033 04/19/18 0515 04/19/18 1344  BP:  111/75 102/63 105/68  Pulse:  87 76 78  Resp:  18  18  Temp:  99 F (37.2 C) 98.9 F (37.2 C) 99 F (37.2 C)  TempSrc:  Oral Oral Oral  SpO2: 95% 96% 95% 96%  Weight:      Height:        Intake/Output Summary (Last 24 hours) at 04/19/2018 1554 Last data filed at 04/19/2018 1500 Gross per 24 hour  Intake 600 ml  Output 2 ml  Net 598 ml   Weight change:  Exam:   General:  Pt is alert, follows commands appropriately,  not in acute distress  HEENT: No icterus, No thrush, No neck mass, [place]/AT  Cardiovascular: RRR, S1/S2, no rubs, no gallops  Respiratory: CTA bilaterally, no wheezing, no crackles, no rhonchi  Abdomen: Soft/+BS, non tender, non distended, no guarding  Extremities: No edema, No lymphangitis, No petechiae, No rashes, no synovitis   Data Reviewed: I have  personally reviewed following labs and imaging studies Basic Metabolic Panel: Recent Labs  Lab 04/17/18 1519 04/18/18 0519 04/19/18 0556  NA 136 138 136  K 3.5 4.2 4.1  CL 99 105 102  CO2 27 27 29   GLUCOSE 93 93 102*  BUN 19 16 11   CREATININE 1.04 0.91 0.90  CALCIUM 9.1 8.1* 8.2*   Liver Function Tests: Recent Labs  Lab 04/17/18 1519 04/19/18 0556  AST 48* 23  ALT 79* 40  ALKPHOS 100 63  BILITOT 0.6 0.6  PROT 8.0 6.0*  ALBUMIN 3.4* 2.3*   No results for input(s): LIPASE, AMYLASE in the last 168 hours. No results for input(s): AMMONIA in the last 168 hours. Coagulation Profile: No results for input(s): INR, PROTIME in the last 168 hours. CBC: Recent Labs  Lab 04/17/18 1519 04/18/18 0519 04/19/18 0556  WBC 10.8* 8.6 8.3  NEUTROABS 7.8* 5.5  --   HGB 16.9 13.1 12.9*  HCT 49.4 39.6 39.1  MCV 95.2 95.7 96.5  PLT 303 241 234   Cardiac Enzymes: Recent Labs  Lab 04/17/18 1519  TROPONINI <0.03   BNP: Invalid input(s): POCBNP CBG: No results for input(s): GLUCAP in the last 168 hours. HbA1C: No results for input(s): HGBA1C in the last 72 hours. Urine analysis:    Component Value Date/Time   COLORURINE YELLOW 07/17/2009 1010   APPEARANCEUR CLEAR 07/17/2009 1010   LABSPEC 1.005 07/17/2009 1010   PHURINE 5.0 07/17/2009 1010   GLUCOSEU NEGATIVE 07/17/2009 1010   HGBUR NEGATIVE 07/17/2009 1010   BILIRUBINUR NEGATIVE 07/17/2009 1010   KETONESUR NEGATIVE 07/17/2009 1010   PROTEINUR NEGATIVE 07/17/2009 1010   UROBILINOGEN 0.2 07/17/2009 1010   NITRITE NEGATIVE 07/17/2009 1010   LEUKOCYTESUR  07/17/2009 1010    NEGATIVE MICROSCOPIC NOT DONE ON URINES WITH NEGATIVE PROTEIN, BLOOD, LEUKOCYTES, NITRITE, OR GLUCOSE <1000 mg/dL.   Sepsis Labs: @LABRCNTIP (procalcitonin:4,lacticidven:4) ) Recent Results (from the past 240 hour(s))  Culture, blood (routine x 2) Call MD if unable to obtain prior to antibiotics being given     Status: None (Preliminary result)    Collection Time: 04/17/18 10:46 PM  Result Value Ref Range Status   Specimen Description RIGHT ANTECUBITAL  Final   Special Requests   Final    BOTTLES DRAWN AEROBIC AND ANAEROBIC Blood Culture results may not be optimal due to an excessive volume of blood received in culture bottles   Culture   Final    NO GROWTH 2 DAYS Performed at [facility], [address], [place], [place] [address]    Report Status PENDING  Incomplete  Culture, blood (routine x 2) Call MD if unable to obtain prior to antibiotics being given     Status: None (Preliminary result)   Collection Time: 04/17/18 11:00 PM  Result Value Ref Range Status   Specimen Description BLOOD LEFT HAND  Final   Special Requests   Final    BOTTLES DRAWN AEROBIC ONLY Blood Culture adequate volume   Culture   Final    NO GROWTH 2 DAYS Performed at [facility], [address], [place], [place] [address]    Report Status PENDING  Incomplete  MRSA  PCR Screening     Status: None   Collection Time: 04/18/18  9:55 AM  Result Value Ref Range Status   MRSA by PCR NEGATIVE NEGATIVE Final    Comment:        The GeneXpert MRSA Assay (FDA approved for NASAL specimens only), is one component of a comprehensive MRSA colonization surveillance program. It is not intended to diagnose MRSA infection nor to guide or monitor treatment for MRSA infections. Performed at [facility], [address] [place], [place] [address]      Scheduled Meds: . aspirin  81 mg Oral Daily  . atorvastatin  40 mg Oral q1800  . enoxaparin (LOVENOX) injection  40 mg Subcutaneous Q24H  . metoprolol succinate  50 mg Oral Daily  . pantoprazole  40 mg Oral Daily  . phenytoin  400 mg Oral Daily   Continuous Infusions: . sodium chloride 75 mL/hr at 04/19/18 1441  . azithromycin Stopped (04/18/18 2135)  . piperacillin-tazobactam (ZOSYN)  IV 3.375 g (04/19/18 1435)    Procedures/Studies: Dg Chest 2 View  Result Date: 04/17/2018 CLINICAL DATA:  SOB, PER ER  NOTE, Pt reports being seen at PCP last week and given abx for uri and steroid and symptoms getting worse. Has been having bad pain in right lower abd and right side of back with coughing. EXAM: CHEST - 2 VIEW COMPARISON:  07/17/2009 FINDINGS: Heart size is normal. There is coarse reticular opacity in the RIGHT UPPER lobe. The appearance raises a question of cavitary lesion rib lesions. The LEFT lung is clear. No pulmonary edema. IMPRESSION: Coarse reticular opacities in the RIGHT UPPER lobe, consistent with inflammatory/infectious process. Cavitary lesions are not excluded. Recommend further evaluation CT of the chest with intravenous contrast. Electronically Signed   By: Norva Pavlov M.D.   On: 04/17/2018 16:00   Ct Angio Chest Pe W And/or Wo Contrast  Result Date: 04/17/2018 CLINICAL DATA:  47 year old male with history of right-sided back pain and coughing. Nonproductive cough. Shortness of breath. Chest congestion. EXAM: CT ANGIOGRAPHY CHEST WITH CONTRAST TECHNIQUE: Multidetector CT imaging of the chest was performed using the standard protocol during bolus administration of intravenous contrast. Multiplanar CT image reconstructions and MIPs were obtained to evaluate the vascular anatomy. CONTRAST:  75mL ISOVUE-370 IOPAMIDOL (ISOVUE-370) INJECTION 76% COMPARISON:  None. FINDINGS: Cardiovascular: No filling defects within the pulmonary arterial tree to suggest underlying pulmonary embolism. Heart size is normal. There is no significant pericardial fluid, thickening or pericardial calcification. There is aortic atherosclerosis, as well as atherosclerosis of the great vessels of the mediastinum and the coronary arteries, including calcified atherosclerotic plaque in the left main, left anterior descending, left circumflex and right coronary arteries. Mediastinum/Nodes: No lymphadenopathy noted in the mediastinal or hilar nodal stations. Prominent borderline enlarged right hilar lymph node measuring 10 mm in  short axis. Esophagus is unremarkable in appearance. No axillary lymphadenopathy. Lungs/Pleura: In the right upper lobe near the apex there is a mass-like area of probable airspace consolidation measuring 5.9 x 4.5 x 3.2 cm (axial image 28 of series 9 and coronal image 83 of series 10), which demonstrates some internal areas of low attenuation, which likely reflect necrosis of the underlying lung tissue. This is in an area of extensive emphysematous changes. No other definite suspicious appearing pulmonary nodules or masses. No pleural effusions. Diffuse bronchial wall thickening with severe centrilobular and paraseptal emphysema most evident throughout the lung apices. Upper Abdomen: Status post cholecystectomy. Musculoskeletal: There are no aggressive appearing lytic or blastic lesions  noted in the visualized portions of the skeleton. Review of the MIP images confirms the above findings. IMPRESSION: 1. No evidence of pulmonary embolism. 2. Mass-like area of apparent airspace consolidation in the right upper lobe with what appears to be internal areas of necrosis, likely to reflect a severe necrotizing pneumonia. There is a prominent borderline enlarged right hilar lymph node associated with this. Repeat contrast enhanced chest CT is recommended in 1 month after trial of antimicrobial therapy to ensure resolution of these findings, as underlying neoplasm is not entirely excluded (but not favored at this time). 3. Diffuse bronchial wall thickening with severe centrilobular and paraseptal emphysema; imaging findings suggestive of underlying COPD. 4. Aortic atherosclerosis, in addition to left main and 3 vessel coronary artery disease. Please note that although the presence of coronary artery calcium documents the presence of coronary artery disease, the severity of this disease and any potential stenosis cannot be assessed on this non-gated CT examination. Assessment for potential risk factor modification, dietary  therapy or pharmacologic therapy may be warranted, if clinically indicated. Aortic Atherosclerosis (ICD10-I70.0) and Emphysema (ICD10-J43.9). Electronically Signed   By: Trudie Reed M.D.   On: 04/17/2018 17:05   US Abdomen Limited Ruq  Result Date: 04/18/2018 CLINICAL DATA:  Right-sided abdominal pain for the past 2 weeks. Elevated LFTs. EXAM: ULTRASOUND ABDOMEN LIMITED RIGHT UPPER QUADRANT COMPARISON:  CT abdomen and pelvis dated July 08, 2004. FINDINGS: Gallbladder: Surgically absent. Common bile duct: Diameter: 4 mm, normal. Liver: No focal lesion identified. Within normal limits in parenchymal echogenicity. Portal vein is patent on color Doppler imaging with normal direction of blood flow towards the liver. IMPRESSION: 1. Prior cholecystectomy. Otherwise normal right upper quadrant ultrasound. Electronically Signed   By: Obie Dredge M.D.   On: 04/18/2018 11:04    Catarina Hartshorn, DO  [facility] Pager [phone]  If 7PM-7AM, please contact night-coverage [email or web address] Password [email or web address] 04/19/2018, 3:54 PM   LOS: 2 days

## 2018-04-20 LAB — CBC
HCT: 40.7 % (ref 39.0–52.0)
Hemoglobin: 13.9 g/dL (ref 13.0–17.0)
MCH: 32.9 pg (ref 26.0–34.0)
MCHC: 34.2 g/dL (ref 30.0–36.0)
MCV: 96.2 fL (ref 78.0–100.0)
Platelets: 252 K/uL (ref 150–400)
RBC: 4.23 MIL/uL (ref 4.22–5.81)
RDW: 13 % (ref 11.5–15.5)
WBC: 8.9 K/uL (ref 4.0–10.5)

## 2018-04-20 LAB — BASIC METABOLIC PANEL WITH GFR
Anion gap: 7 (ref 5–15)
BUN: 13 mg/dL (ref 6–20)
CO2: 28 mmol/L (ref 22–32)
Calcium: 8.5 mg/dL — ABNORMAL LOW (ref 8.9–10.3)
Chloride: 102 mmol/L (ref 98–111)
Creatinine, Ser: 0.94 mg/dL (ref 0.61–1.24)
GFR calc Af Amer: >60 mL/min (ref >60)
GFR calc non Af Amer: >60 mL/min (ref >60)
Glucose, Bld: 98 mg/dL (ref 70–99)
Potassium: 4.5 mmol/L (ref 3.5–5.1)
Sodium: 137 mmol/L (ref 135–145)

## 2018-04-20 LAB — ANCA TITERS: Atypical P-ANCA titer: <1:20 {titer}

## 2018-04-20 LAB — MPO/PR-3 (ANCA) ANTIBODIES
ANCA Proteinase 3: <3.5 U/mL (ref 0.0–3.5)
Myeloperoxidase Abs: <9 U/mL (ref 0.0–9.0)

## 2018-04-20 MED ORDER — AMOXICILLIN-POT CLAVULANATE 875-125 MG PO TABS
1.0000 | ORAL_TABLET | Freq: Two times a day (BID) | ORAL | Status: DC
  Administered 2018-04-20: 1 via ORAL
  Filled 2018-04-20: qty 1

## 2018-04-20 MED ORDER — OXYCODONE-ACETAMINOPHEN 5-325 MG PO TABS: 1.0000 | ORAL_TABLET | Freq: Four times a day (QID) | ORAL | 0 refills | Status: DC | PRN

## 2018-04-20 MED ORDER — AMOXICILLIN-POT CLAVULANATE 875-125 MG PO TABS: 1.0000 | ORAL_TABLET | Freq: Two times a day (BID) | ORAL | 0 refills | Status: AC

## 2018-04-20 NOTE — Discharge Summary (Signed)
Physician Discharge Summary  Andre [name] IOX:[id number] DOB: [birth year] DOA: 04/17/2018  PCP: Elfredia Nevins, MD  Admit date: 04/17/2018 Discharge date: 04/20/2018  Time spent: 45 minutes  Recommendations for Outpatient Follow-up:  -Will be discharged home today. -To complete 28 days of Augmentin for severe necrotizing pneumonia per pulmonary recommendations. -Will need repeat CT chest with contrast once he completes course of antibiotics to assess resolution of pneumonia and ensure no underlying mass is present. -Advise follow-up with PCP in 4 weeks.  Discharge Diagnoses:  Principal Problem:   Cavitary pneumonia Active Problems:   Dyslipidemia   Essential hypertension   Coronary atherosclerosis   CAD, NATIVE VESSEL   Seizure disorder (HCC)   Acute respiratory failure with hypoxia (HCC)   Necrotizing pneumonia (HCC)   Transaminasemia   Discharge Condition: Stable and improved  Filed Weights   04/17/18 1505  Weight: 72.6 kg    History of present illness:  As per Dr. Ophelia Charter on 9/1: Andre [name] is a 47 y.o. male with medical history significant of CAD and HTN presenting with SOB.  Symptoms started last weekend.  He noticed difficulty breathing.  If he yawns, coughs, takes a deep breath, he has significant pleuritic CP.  He was diagnosed Monday with URI and given antibiotics and prednisone.  He completed all of the steroids and has a few antibiotics left.  SOB is with exertion or excessive coughing.  Cough is nonproductive (a bit of tan drainage at first and now rare clear sputum).  +fever to 102.  No LAD.  No wheezing.  No sore teeth.  Does not drink ETOH or smoke.  He occasionally uses oral tobacco, maybe a can a week.  His pain is in his right chest and by his shoulder blades.  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   ED Course:  URI, treated with antibiotics and steroids (completed).  Ongoing SOB, cough, chest pain.  Cavitary lesion on CXR, has a  "severe necrotizing pneumonia."  Will treat with Unasyn, also with hypoxia.  Hospital Course:   Acute hypoxemic respiratory failure -Secondary to necrotizing pneumonia, no further oxygen requirement.  Necrotizing/cavitary pneumonia -Per discussion with pulmonology on admission, will treat with 4 weeks of Augmentin, at 4 to 6 weeks will need repeat CT chest to ensure no underlying mass, if mass is present will need CT-guided biopsy. -All patient's cultures remain negative on discharge, blood cultures are negative at day 4.  History of coronary artery disease -Status post bare-metal stent to the RCA in 2002. -Continue aspirin, statin, metoprolol. -No current chest pain.  Hypertension, benign essential -Fair control, continue metoprolol.  Hyperlipidemia -Continue statin.  Procedures:  None   Consultations:  None  Discharge Instructions  Discharge Instructions    Diet - low sodium heart healthy   Complete by:  As directed    Increase activity slowly   Complete by:  As directed      Allergies as of 04/20/2018   No Known Allergies     Medication List    TAKE these medications   amoxicillin-clavulanate 875-125 MG tablet Commonly known as:  AUGMENTIN Take 1 tablet by mouth every 12 (twelve) hours for 28 days.   aspirin 325 MG tablet Take 325 mg by mouth every morning.   atorvastatin 40 MG tablet Commonly known as:  LIPITOR Take 40 mg by mouth every morning.   metoprolol succinate 50 MG 24 hr tablet Commonly known as:  TOPROL-XL TAKE 1 & 1/2  TABLETS DAILY   nitroGLYCERIN 0.4 MG SL tablet Commonly known as:  NITROSTAT Place 1 tablet (0.4 mg total) under the tongue every 5 (five) minutes as needed for chest pain (up to 3 doses in 15 mins).   omeprazole 20 MG capsule Commonly known as:  PRILOSEC Take 20 mg by mouth every morning.   oxyCODONE-acetaminophen 5-325 MG tablet Commonly known as:  PERCOCET/ROXICET Take 1 tablet by mouth every 6 (six) hours as needed  for moderate pain.   phenytoin 100 MG ER capsule Commonly known as:  DILANTIN Take 400 mg by mouth every morning.      No Known Allergies Follow-up Information    Elfredia Nevins, MD. Schedule an appointment as soon as possible for a visit in 4 week(s).   Specialty:  Internal Medicine Contact information: [address] [place] [address] [phone]            The results of significant diagnostics from this hospitalization (including imaging, microbiology, ancillary and laboratory) are listed below for reference.    Significant Diagnostic Studies: Dg Chest 2 View  Result Date: 04/17/2018 CLINICAL DATA:  SOB, PER ER NOTE, Pt reports being seen at PCP last week and given abx for uri and steroid and symptoms getting worse. Has been having bad pain in right lower abd and right side of back with coughing. EXAM: CHEST - 2 VIEW COMPARISON:  07/17/2009 FINDINGS: Heart size is normal. There is coarse reticular opacity in the RIGHT UPPER lobe. The appearance raises a question of cavitary lesion rib lesions. The LEFT lung is clear. No pulmonary edema. IMPRESSION: Coarse reticular opacities in the RIGHT UPPER lobe, consistent with inflammatory/infectious process. Cavitary lesions are not excluded. Recommend further evaluation CT of the chest with intravenous contrast. Electronically Signed   By: Norva Pavlov M.D.   On: 04/17/2018 16:00   Ct Angio Chest Pe W And/or Wo Contrast  Result Date: 04/17/2018 CLINICAL DATA:  47 year old male with history of right-sided back pain and coughing. Nonproductive cough. Shortness of breath. Chest congestion. EXAM: CT ANGIOGRAPHY CHEST WITH CONTRAST TECHNIQUE: Multidetector CT imaging of the chest was performed using the standard protocol during bolus administration of intravenous contrast. Multiplanar CT image reconstructions and MIPs were obtained to evaluate the vascular anatomy. CONTRAST:  75mL ISOVUE-370 IOPAMIDOL (ISOVUE-370) INJECTION 76%  COMPARISON:  None. FINDINGS: Cardiovascular: No filling defects within the pulmonary arterial tree to suggest underlying pulmonary embolism. Heart size is normal. There is no significant pericardial fluid, thickening or pericardial calcification. There is aortic atherosclerosis, as well as atherosclerosis of the great vessels of the mediastinum and the coronary arteries, including calcified atherosclerotic plaque in the left main, left anterior descending, left circumflex and right coronary arteries. Mediastinum/Nodes: No lymphadenopathy noted in the mediastinal or hilar nodal stations. Prominent borderline enlarged right hilar lymph node measuring 10 mm in short axis. Esophagus is unremarkable in appearance. No axillary lymphadenopathy. Lungs/Pleura: In the right upper lobe near the apex there is a mass-like area of probable airspace consolidation measuring 5.9 x 4.5 x 3.2 cm (axial image 28 of series 9 and coronal image 83 of series 10), which demonstrates some internal areas of low attenuation, which likely reflect necrosis of the underlying lung tissue. This is in an area of extensive emphysematous changes. No other definite suspicious appearing pulmonary nodules or masses. No pleural effusions. Diffuse bronchial wall thickening with severe centrilobular and paraseptal emphysema most evident throughout the lung apices. Upper Abdomen: Status post cholecystectomy. Musculoskeletal: There are no aggressive appearing lytic  or blastic lesions noted in the visualized portions of the skeleton. Review of the MIP images confirms the above findings. IMPRESSION: 1. No evidence of pulmonary embolism. 2. Mass-like area of apparent airspace consolidation in the right upper lobe with what appears to be internal areas of necrosis, likely to reflect a severe necrotizing pneumonia. There is a prominent borderline enlarged right hilar lymph node associated with this. Repeat contrast enhanced chest CT is recommended in 1 month after  trial of antimicrobial therapy to ensure resolution of these findings, as underlying neoplasm is not entirely excluded (but not favored at this time). 3. Diffuse bronchial wall thickening with severe centrilobular and paraseptal emphysema; imaging findings suggestive of underlying COPD. 4. Aortic atherosclerosis, in addition to left main and 3 vessel coronary artery disease. Please note that although the presence of coronary artery calcium documents the presence of coronary artery disease, the severity of this disease and any potential stenosis cannot be assessed on this non-gated CT examination. Assessment for potential risk factor modification, dietary therapy or pharmacologic therapy may be warranted, if clinically indicated. Aortic Atherosclerosis (ICD10-I70.0) and Emphysema (ICD10-J43.9). Electronically Signed   By: Trudie Reed M.D.   On: 04/17/2018 17:05   US Abdomen Limited Ruq  Result Date: 04/18/2018 CLINICAL DATA:  Right-sided abdominal pain for the past 2 weeks. Elevated LFTs. EXAM: ULTRASOUND ABDOMEN LIMITED RIGHT UPPER QUADRANT COMPARISON:  CT abdomen and pelvis dated July 08, 2004. FINDINGS: Gallbladder: Surgically absent. Common bile duct: Diameter: 4 mm, normal. Liver: No focal lesion identified. Within normal limits in parenchymal echogenicity. Portal vein is patent on color Doppler imaging with normal direction of blood flow towards the liver. IMPRESSION: 1. Prior cholecystectomy. Otherwise normal right upper quadrant ultrasound. Electronically Signed   By: Obie Dredge M.D.   On: 04/18/2018 11:04    Microbiology: Recent Results (from the past 240 hour(s))  Culture, blood (routine x 2) Call MD if unable to obtain prior to antibiotics being given     Status: None (Preliminary result)   Collection Time: 04/17/18 10:46 PM  Result Value Ref Range Status   Specimen Description RIGHT ANTECUBITAL  Final   Special Requests   Final    BOTTLES DRAWN AEROBIC AND ANAEROBIC Blood  Culture results may not be optimal due to an excessive volume of blood received in culture bottles   Culture   Final    NO GROWTH 3 DAYS Performed at [facility], [address] [place], [place] [address]    Report Status PENDING  Incomplete  Culture, blood (routine x 2) Call MD if unable to obtain prior to antibiotics being given     Status: None (Preliminary result)   Collection Time: 04/17/18 11:00 PM  Result Value Ref Range Status   Specimen Description BLOOD LEFT HAND  Final   Special Requests   Final    BOTTLES DRAWN AEROBIC ONLY Blood Culture adequate volume   Culture   Final    NO GROWTH 3 DAYS Performed at [facility], [address] [place], [place] [address]    Report Status PENDING  Incomplete  MRSA PCR Screening     Status: None   Collection Time: 04/18/18  9:55 AM  Result Value Ref Range Status   MRSA by PCR NEGATIVE NEGATIVE Final    Comment:        The GeneXpert MRSA Assay (FDA approved for NASAL specimens only), is one component of a comprehensive MRSA colonization surveillance program. It is not intended to diagnose MRSA infection nor  to guide or monitor treatment for MRSA infections. Performed at [facility], [address] [place], [place] [address]      Labs: Basic Metabolic Panel: Recent Labs  Lab 04/17/18 1519 04/18/18 0519 04/19/18 0556 04/20/18 0536  NA 136 138 136 137  K 3.5 4.2 4.1 4.5  CL 99 105 102 102  CO2 27 27 29 28   GLUCOSE 93 93 102* 98  BUN 19 16 11 13   CREATININE 1.04 0.91 0.90 0.94  CALCIUM 9.1 8.1* 8.2* 8.5*   Liver Function Tests: Recent Labs  Lab 04/17/18 1519 04/19/18 0556  AST 48* 23  ALT 79* 40  ALKPHOS 100 63  BILITOT 0.6 0.6  PROT 8.0 6.0*  ALBUMIN 3.4* 2.3*   No results for input(s): LIPASE, AMYLASE in the last 168 hours. No results for input(s): AMMONIA in the last 168 hours. CBC: Recent Labs  Lab 04/17/18 1519 04/18/18 0519 04/19/18 0556 04/20/18 0536  WBC 10.8* 8.6 8.3 8.9  NEUTROABS 7.8* 5.5   --   --   HGB 16.9 13.1 12.9* 13.9  HCT 49.4 39.6 39.1 40.7  MCV 95.2 95.7 96.5 96.2  PLT 303 241 234 252   Cardiac Enzymes: Recent Labs  Lab 04/17/18 1519  TROPONINI <0.03   BNP: BNP (last 3 results) Recent Labs    04/17/18 1519  BNP 45.0    ProBNP (last 3 results) No results for input(s): PROBNP in the last 8760 hours.  CBG: No results for input(s): GLUCAP in the last 168 hours.     Signed:  Chaya Jan  [facility] Pager: [phone] 04/20/2018, 12:36 PM

## 2018-04-20 NOTE — Discharge Planning (Signed)
Patient IV removed. RN assessment and VS revealed stability for DC to home.  Discharge papers given, explained and educated.  Informed of suggested FU appt and patient agreed to contact to set up since office closed for lunch. Scripts printed, signed and given.  Once ready, will be wheeled to front and family transporting home via car.

## 2018-04-21 LAB — ANTINUCLEAR ANTIBODIES, IFA: ANA Ab, IFA: NEGATIVE

## 2018-04-22 LAB — CULTURE, BLOOD (ROUTINE X 2)
CULTURE: NO GROWTH
Culture: NO GROWTH
Special Requests: ADEQUATE

## 2018-04-22 LAB — QUANTIFERON-TB GOLD PLUS (RQFGPL)
QuantiFERON Mitogen Value: 4.06 IU/mL
QuantiFERON Nil Value: 0.03 IU/mL
QuantiFERON TB1 Ag Value: 0.02 IU/mL
QuantiFERON TB2 Ag Value: 0.04 IU/mL

## 2018-04-22 LAB — QUANTIFERON-TB GOLD PLUS: QUANTIFERON-TB GOLD PLUS: NEGATIVE

## 2018-05-09 ENCOUNTER — Other Ambulatory Visit (HOSPITAL_COMMUNITY): Payer: Self-pay | Admitting: Physician Assistant

## 2018-05-09 DIAGNOSIS — J852 Abscess of lung without pneumonia: Secondary | ICD-10-CM

## 2018-05-16 ENCOUNTER — Ambulatory Visit (HOSPITAL_COMMUNITY): Payer: PRIVATE HEALTH INSURANCE

## 2018-05-16 ENCOUNTER — Encounter (HOSPITAL_COMMUNITY): Payer: Self-pay

## 2018-05-26 ENCOUNTER — Ambulatory Visit (HOSPITAL_COMMUNITY)
Admission: RE | Admit: 2018-05-26 | Discharge: 2018-05-26 | Disposition: A | Discharge status: Home / Self Care | Payer: PRIVATE HEALTH INSURANCE | Source: Ambulatory Visit | Attending: Physician Assistant | Admitting: Physician Assistant

## 2018-05-26 DIAGNOSIS — I700 Atherosclerosis of aorta: Secondary | ICD-10-CM | POA: Diagnosis not present

## 2018-05-26 DIAGNOSIS — J852 Abscess of lung without pneumonia: Secondary | ICD-10-CM

## 2018-05-26 DIAGNOSIS — I2510 Atherosclerotic heart disease of native coronary artery without angina pectoris: Secondary | ICD-10-CM | POA: Insufficient documentation

## 2018-05-26 DIAGNOSIS — R918 Other nonspecific abnormal finding of lung field: Secondary | ICD-10-CM | POA: Insufficient documentation

## 2018-05-26 DIAGNOSIS — J438 Other emphysema: Secondary | ICD-10-CM | POA: Diagnosis not present

## 2018-06-01 ENCOUNTER — Ambulatory Visit (INDEPENDENT_AMBULATORY_CARE_PROVIDER_SITE_OTHER): Payer: PRIVATE HEALTH INSURANCE | Admitting: Pulmonary Disease

## 2018-06-01 ENCOUNTER — Encounter: Payer: Self-pay | Admitting: Pulmonary Disease

## 2018-06-01 VITALS — BP 132/78 | HR 72 | Ht 68.0 in | Wt 171.4 lb

## 2018-06-01 DIAGNOSIS — J189 Pneumonia, unspecified organism: Secondary | ICD-10-CM | POA: Diagnosis not present

## 2018-06-01 DIAGNOSIS — J984 Other disorders of lung: Secondary | ICD-10-CM | POA: Diagnosis not present

## 2018-06-01 NOTE — Progress Notes (Signed)
Andre [name]    [id number]    [birth year]  Primary Care Physician:Fusco, Lyman Bishop, MD  Referring Physician: Elfredia Nevins, MD [address] [place], [place] [address]  Chief complaint: Consult for abnormal CT scan, follow-up after necrotizing pneumonia.  HPI: 47 year old ex-smoker with history of coronary artery disease, hypertension, hyperlipidemia Treated for necrotizing right upper lobe pneumonia in September 2019.  He was hospitalized for 3 days at [facility].  Treated with prolonged course- 4 weeks of Augmentin per pulmonary recommendation. Follow-up CT scan this month shows improving right upper lobe infiltrate.  In addition he has moderate edematous changes  States that his breathing is doing well.  Denies any cough, sputum production, fevers, chills No dyspnea on exertion.  He is able to do daily activity and hobbies including hunting and fishing without any problem.  Pets: 2 dogs, no cats, birds, farm animals Occupation: Works for [employer], [place]. Exposures: No known exposures, no mold, hot tub, Jacuzzi Smoking history: 20-pack-year smoker.  Quit in 2001. Travel history: No significant travel history Relevant family history: No significant family history of lung disease, emphysema.  Outpatient Encounter Medications as of 06/01/2018  Medication Sig  . aspirin 325 MG tablet Take 325 mg by mouth every morning.   Marland Kitchen atorvastatin (LIPITOR) 40 MG tablet Take 40 mg by mouth every morning.   . metoprolol (TOPROL-XL) 50 MG 24 hr tablet TAKE 1 & 1/2 TABLETS DAILY  . nitroGLYCERIN (NITROSTAT) 0.4 MG SL tablet Place 1 tablet (0.4 mg total) under the tongue every 5 (five) minutes as needed for chest pain (up to 3 doses in 15 mins).  Marland Kitchen omeprazole (PRILOSEC) 20 MG capsule Take 20 mg by mouth every morning.   Marland Kitchen oxyCODONE-acetaminophen (PERCOCET/ROXICET) 5-325 MG tablet Take 1 tablet by mouth every 6 (six) hours as needed for moderate pain.  .  phenytoin (DILANTIN) 100 MG ER capsule Take 400 mg by mouth every morning.    No facility-administered encounter medications on file as of 06/01/2018.     Allergies as of 06/01/2018  . (No Known Allergies)    Past Medical History:  Diagnosis Date  . Coronary artery disease 1999   stents x 2  . GERD (gastroesophageal reflux disease)   . Hypertension   . Palpitations   . Seizures (HCC)    last seizure was in 1994    Past Surgical History:  Procedure Laterality Date  . APPENDECTOMY    . BRAIN SURGERY     as an infant  . CHOLECYSTECTOMY      Family History  Problem Relation Age of Onset  . Heart attack Mother   . Heart attack Father   . Lung disease Neg Hx     Social History   Socioeconomic History  . Marital status: Legally Separated    Spouse name: Not on file  . Number of children: Not on file  . Years of education: Not on file  . Highest education level: Not on file  Occupational History  . Not on file  Social Needs  . Financial resource strain: Not on file  . Food insecurity:    Worry: Not on file    Inability: Not on file  . Transportation needs:    Medical: Not on file    Non-medical: Not on file  Tobacco Use  . Smoking status: Former Smoker    Start date: 10/01/1989    Last attempt to quit: 07/1998  Years since quitting: 19.8  . Smokeless tobacco: Former Neurosurgeon    Types: Snuff    Quit date: 04/2018  Substance and Sexual Activity  . Alcohol use: No  . Drug use: Never  . Sexual activity: Not on file  Lifestyle  . Physical activity:    Days per week: Not on file    Minutes per session: Not on file  . Stress: Not on file  Relationships  . Social connections:    Talks on phone: Not on file    Gets together: Not on file    Attends religious service: Not on file    Active member of club or organization: Not on file    Attends meetings of clubs or organizations: Not on file    Relationship status: Not on file  . Intimate partner violence:     Fear of current or ex partner: Not on file    Emotionally abused: Not on file    Physically abused: Not on file    Forced sexual activity: Not on file  Other Topics Concern  . Not on file  Social History Narrative  . Not on file   Review of systems: Review of Systems  Constitutional: Negative for fever and chills.  HENT: Negative.   Eyes: Negative for blurred vision.  Respiratory: as per HPI  Cardiovascular: Negative for chest pain and palpitations.  Gastrointestinal: Negative for vomiting, diarrhea, blood per rectum. Genitourinary: Negative for dysuria, urgency, frequency and hematuria.  Musculoskeletal: Negative for myalgias, back pain and joint pain.  Skin: Negative for itching and rash.  Neurological: Negative for dizziness, tremors, focal weakness, seizures and loss of consciousness.  Endo/Heme/Allergies: Negative for environmental allergies.  Psychiatric/Behavioral: Negative for depression, suicidal ideas and hallucinations.  All other systems reviewed and are negative.  Physical Exam: Blood pressure 132/78, pulse 72, height 5\' 8"  (1.727 m), weight 171 lb 6.4 oz (77.7 kg), SpO2 98 %. Gen:      No acute distress HEENT:  EOMI, sclera anicteric Neck:     No masses; no thyromegaly Lungs:    Clear to auscultation bilaterally; normal respiratory effort CV:         Regular rate and rhythm; no murmurs Abd:      + bowel sounds; soft, non-tender; no palpable masses, no distension Ext:    No edema; adequate peripheral perfusion Skin:      Warm and dry; no rash Neuro: alert and oriented x 3 Psych: normal mood and affect  Data Reviewed: Imaging: CTA 04/17/2018-no pulmonary embolism, masslike airspace consolidation in the right upper lobe with internal necrosis.  Borderline right hilar lymph node.  Severe centrilobular, paraseptal emphysema, atherosclerosis, three-vessel coronary artery calcification  CT scan 05/26/2018- right upper lobe opacity is smaller in size.  Emphysematous  changes, aortic atherosclerosis.  I have reviewed the images personally.  Assessment:  Right upper lobe opacity Follow-up after recent necrotizing pneumonia.  The right upper lobe opacity is smaller which is consistent with resolving pneumonia.  I suspect he will have residual scarring in that area. We will get a follow-up CT in 3 months to ensure continued improvement  Severe emphysema Likely secondary to smoking.  I recommended pulmonary function test and alpha-1 antitrypsin testing but he would like to defer for now Reassess at return visit.  Plan/Recommendations: - CT without contrast in 3 months.  Chilton Greathouse MD [facility] 06/01/2018, 3:39 PM  CC: Elfredia Nevins, MD

## 2018-06-01 NOTE — Patient Instructions (Addendum)
We will get a CT chest without contrast in 3 months for follow-up of pneumonia Follow-up in clinic after CT scan.

## 2018-06-27 ENCOUNTER — Encounter: Payer: Self-pay | Admitting: Thoracic Surgery (Cardiothoracic Vascular Surgery)

## 2018-06-29 ENCOUNTER — Encounter: Payer: Self-pay | Admitting: Thoracic Surgery (Cardiothoracic Vascular Surgery)

## 2018-07-11 ENCOUNTER — Telehealth: Payer: Self-pay | Admitting: *Deleted

## 2018-07-11 NOTE — Telephone Encounter (Signed)
Called patient to r/s missed appointment from 06/27/2018, left vm for patient to call our office to r/s

## 2018-07-22 ENCOUNTER — Telehealth: Payer: Self-pay | Admitting: *Deleted

## 2018-07-22 NOTE — Telephone Encounter (Signed)
Called patient to reschedule appointment missed 06/27/18, left vm to call our office/cm

## 2018-08-05 ENCOUNTER — Other Ambulatory Visit (HOSPITAL_COMMUNITY): Payer: Self-pay | Admitting: Family Medicine

## 2018-08-05 ENCOUNTER — Ambulatory Visit (HOSPITAL_COMMUNITY)
Admission: RE | Admit: 2018-08-05 | Discharge: 2018-08-05 | Disposition: A | Discharge status: Home / Self Care | Payer: PRIVATE HEALTH INSURANCE | Source: Ambulatory Visit | Attending: Family Medicine | Admitting: Family Medicine

## 2018-08-05 DIAGNOSIS — R0781 Pleurodynia: Secondary | ICD-10-CM

## 2018-09-01 ENCOUNTER — Ambulatory Visit (HOSPITAL_COMMUNITY)
Admission: RE | Admit: 2018-09-01 | Discharge: 2018-09-01 | Disposition: A | Discharge status: Home / Self Care | Payer: PRIVATE HEALTH INSURANCE | Source: Ambulatory Visit | Attending: Pulmonary Disease | Admitting: Pulmonary Disease

## 2018-09-01 DIAGNOSIS — J984 Other disorders of lung: Secondary | ICD-10-CM | POA: Insufficient documentation

## 2018-09-01 DIAGNOSIS — J189 Pneumonia, unspecified organism: Secondary | ICD-10-CM | POA: Insufficient documentation

## 2018-09-06 ENCOUNTER — Encounter: Payer: Self-pay | Admitting: Pulmonary Disease

## 2018-09-06 ENCOUNTER — Ambulatory Visit (INDEPENDENT_AMBULATORY_CARE_PROVIDER_SITE_OTHER): Payer: PRIVATE HEALTH INSURANCE | Admitting: Pulmonary Disease

## 2018-09-06 VITALS — BP 130/68 | HR 41 | Ht 68.0 in | Wt 172.6 lb

## 2018-09-06 DIAGNOSIS — J438 Other emphysema: Secondary | ICD-10-CM

## 2018-09-06 DIAGNOSIS — J984 Other disorders of lung: Secondary | ICD-10-CM | POA: Diagnosis not present

## 2018-09-06 DIAGNOSIS — J189 Pneumonia, unspecified organism: Secondary | ICD-10-CM | POA: Diagnosis not present

## 2018-09-06 NOTE — Progress Notes (Signed)
Andre [name]    [id number]    January 27, 1971  Primary Care Physician:Fusco, Lyman Bishop, MD  Referring Physician: Elfredia Nevins, MD [address] [place], [place] [address]  Chief complaint: Follow-up for emphysema, abnormal CT scan, follow-up after necrotizing pneumonia.  HPI: 48 year old ex-smoker with history of coronary artery disease, hypertension, hyperlipidemia Treated for necrotizing right upper lobe pneumonia in September 2019.  He was hospitalized for 3 days at [facility].  Treated with prolonged course- 4 weeks of Augmentin per pulmonary recommendation. Follow-up CT scan this month shows improving right upper lobe infiltrate.  In addition he has moderate edematous changes  States that his breathing is doing well.  Denies any cough, sputum production, fevers, chills No dyspnea on exertion.  He is able to do daily activity and hobbies including hunting and fishing without any problem.  Pets: 2 dogs, no cats, birds, farm animals Occupation: Works for [employer], [place] [employer] [place]. Exposures: No known exposures, no mold, hot tub, Jacuzzi Smoking history: 20-pack-year smoker.  Quit in 2001. Travel history: No significant travel history Relevant family history: No significant family history of lung disease, emphysema.  Interim History: States that his breathing is doing well with no issues He is here for review of follow-up CT  Outpatient Encounter Medications as of 09/06/2018  Medication Sig  . aspirin 325 MG tablet Take 325 mg by mouth every morning.   Marland Kitchen atorvastatin (LIPITOR) 40 MG tablet Take 40 mg by mouth every morning.   . metoprolol (TOPROL-XL) 50 MG 24 hr tablet TAKE 1 & 1/2 TABLETS DAILY  . nitroGLYCERIN (NITROSTAT) 0.4 MG SL tablet Place 1 tablet (0.4 mg total) under the tongue every 5 (five) minutes as needed for chest pain (up to 3 doses in 15 mins).  Marland Kitchen omeprazole (PRILOSEC) 20 MG capsule Take 20 mg by mouth every morning.   . phenytoin  (DILANTIN) 100 MG ER capsule Take 400 mg by mouth every morning.   . [DISCONTINUED] oxyCODONE-acetaminophen (PERCOCET/ROXICET) 5-325 MG tablet Take 1 tablet by mouth every 6 (six) hours as needed for moderate pain.   No facility-administered encounter medications on file as of 09/06/2018.    Physical Exam: Blood pressure 130/68, pulse (!) 41, height 5\' 8"  (1.727 m), weight 172 lb 9.6 oz (78.3 kg), SpO2 98 %. Gen:      No acute distress HEENT:  EOMI, sclera anicteric Neck:     No masses; no thyromegaly Lungs:    Clear to auscultation bilaterally; normal respiratory effort CV:         Regular rate and rhythm; no murmurs Abd:      + bowel sounds; soft, non-tender; no palpable masses, no distension Ext:    No edema; adequate peripheral perfusion Skin:      Warm and dry; no rash Neuro: alert and oriented x 3 Psych: normal mood and affect  Data Reviewed: Imaging: CTA 04/17/2018-no pulmonary embolism, masslike airspace consolidation in the right upper lobe with internal necrosis.  Borderline right hilar lymph node.  Severe centrilobular, paraseptal emphysema, atherosclerosis, three-vessel coronary artery calcification  CT scan 05/26/2018- right upper lobe opacity is smaller in size.  Emphysematous changes, aortic atherosclerosis.  CT scan 09/01/2018- right upper lobe consolidation has resolved.  This is permanent mild groundglass attenuation and minimal scarring.  Moderate emphysematous changes, three-vessel coronary atherosclerosis I have reviewed the images personally  Assessment:  Right upper lobe opacity Follow-up after recent necrotizing pneumonia.   CT scan shows resolution of  the pneumonia with mild residual scarring.  No further follow-up needed  Severe emphysema Likely secondary to smoking.  Schedule pulmonary function test and alpha-1 antitrypsin testing No inhaler therapy needed for now as he is asymptomatic.  Coronary atherosclerosis This is a known problem.  He has had  stenting in the past and has follow-up with cardiology.  Plan/Recommendations: -PFTs, alpha-1 antitrypsin levels and phenotype  Chilton Greathouse MD [facility] 09/06/2018, 4:25 PM  CC: Elfredia Nevins, MD

## 2018-09-06 NOTE — Patient Instructions (Signed)
Your CT scan shows continued improvement in pneumonia.  There is very minimal scarring left in that area which is good news  We will get some blood test today including CBC differential, alpha-1 antitrypsin levels and phenotype We will schedule you for pulmonary function testing follow-up in 3 months

## 2018-09-10 LAB — ALPHA-1 ANTITRYPSIN PHENOTYPE: A-1 Antitrypsin, Ser: 174 mg/dL (ref 83–199)

## 2018-09-27 ENCOUNTER — Telehealth: Payer: Self-pay | Admitting: Pulmonary Disease

## 2018-09-27 NOTE — Telephone Encounter (Signed)
Called the patient back and confirmed he was calling regarding his upcoming appointment. Patient was advised of his PFT and OV on 12/06/18 with Dr. Isaiah Serge. Advised patient respiratory medications were not to be used 3 hours prior to PFT. Confirmed with the patient he already had received result information from his January office visit. Patient voiced understanding.  Nothing further needed at this time.

## 2018-09-28 NOTE — Progress Notes (Signed)
Spoke with pt and notified of results per Dr. Mannam.  Pt verbalized understanding and denied any questions. 

## 2018-11-23 ENCOUNTER — Encounter: Payer: Self-pay | Admitting: Pulmonary Disease

## 2018-12-06 ENCOUNTER — Ambulatory Visit: Payer: Self-pay | Admitting: Pulmonary Disease

## 2018-12-15 ENCOUNTER — Ambulatory Visit: Payer: Self-pay | Admitting: Pulmonary Disease

## 2019-06-08 ENCOUNTER — Other Ambulatory Visit (HOSPITAL_COMMUNITY): Payer: Self-pay | Admitting: Physician Assistant

## 2019-06-08 ENCOUNTER — Other Ambulatory Visit: Payer: Self-pay

## 2019-06-08 ENCOUNTER — Ambulatory Visit (HOSPITAL_COMMUNITY)
Admission: RE | Admit: 2019-06-08 | Discharge: 2019-06-08 | Disposition: A | Discharge status: Home / Self Care | Payer: PRIVATE HEALTH INSURANCE | Source: Ambulatory Visit | Attending: Physician Assistant | Admitting: Physician Assistant

## 2019-06-08 DIAGNOSIS — R0789 Other chest pain: Secondary | ICD-10-CM | POA: Diagnosis present

## 2019-06-08 DIAGNOSIS — R0602 Shortness of breath: Secondary | ICD-10-CM | POA: Insufficient documentation

## 2019-08-21 ENCOUNTER — Other Ambulatory Visit: Payer: Self-pay

## 2019-08-21 ENCOUNTER — Other Ambulatory Visit (HOSPITAL_COMMUNITY): Payer: Self-pay | Admitting: Internal Medicine

## 2019-08-21 ENCOUNTER — Ambulatory Visit (HOSPITAL_COMMUNITY)
Admission: RE | Admit: 2019-08-21 | Discharge: 2019-08-21 | Disposition: A | Discharge status: Home / Self Care | Payer: PRIVATE HEALTH INSURANCE | Source: Ambulatory Visit | Attending: Internal Medicine | Admitting: Internal Medicine

## 2019-08-21 DIAGNOSIS — M545 Low back pain, unspecified: Secondary | ICD-10-CM

## 2019-10-19 ENCOUNTER — Ambulatory Visit: Payer: PRIVATE HEALTH INSURANCE | Attending: Internal Medicine

## 2019-10-19 DIAGNOSIS — Z23 Encounter for immunization: Secondary | ICD-10-CM | POA: Insufficient documentation

## 2019-10-19 NOTE — Progress Notes (Signed)
   Covid-19 Vaccination Clinic  Name:  Andre [name]    MRN: [id number] DOB: 03-22-1971  10/19/2019  [name] was observed post Covid-19 immunization for 15 minutes without incident. He was provided with Vaccine Information Sheet and instruction to access the V-Safe system.   [name] was instructed to call 911 with any severe reactions post vaccine: Marland Kitchen Difficulty breathing  . Swelling of face and throat  . A fast heartbeat  . A bad rash all over body  . Dizziness and weakness   Immunizations Administered    Name Date Dose VIS Date Route   Moderna COVID-19 Vaccine 10/19/2019  9:31 AM 0.5 mL 07/18/2019 Intramuscular   Manufacturer: Moderna   Lot: [id number]   NDC: 67425-525-89

## 2019-11-21 ENCOUNTER — Ambulatory Visit: Payer: PRIVATE HEALTH INSURANCE | Attending: Internal Medicine

## 2019-11-21 DIAGNOSIS — Z23 Encounter for immunization: Secondary | ICD-10-CM

## 2019-11-21 NOTE — Progress Notes (Signed)
   Covid-19 Vaccination Clinic  Name:  Andre [name]    MRN: [id number] DOB: [birth year]  11/21/2019  Mr. Vazguez was observed post Covid-19 immunization for 15 minutes without incident. He was provided with Vaccine Information Sheet and instruction to access the V-Safe system.   Mr. Allman was instructed to call 911 with any severe reactions post vaccine: Marland Kitchen Difficulty breathing  . Swelling of face and throat  . A fast heartbeat  . A bad rash all over body  . Dizziness and weakness   Immunizations Administered    Name Date Dose VIS Date Route   Moderna COVID-19 Vaccine 11/21/2019  8:27 AM 0.5 mL 07/18/2019 Intramuscular   Manufacturer: Moderna   Lot: [id number]   NDC: 84835-075-73

## 2020-11-13 DIAGNOSIS — N342 Other urethritis: Secondary | ICD-10-CM | POA: Diagnosis not present

## 2021-01-14 DIAGNOSIS — L989 Disorder of the skin and subcutaneous tissue, unspecified: Secondary | ICD-10-CM | POA: Diagnosis not present

## 2021-01-14 DIAGNOSIS — Z1331 Encounter for screening for depression: Secondary | ICD-10-CM | POA: Diagnosis not present

## 2021-01-14 DIAGNOSIS — Z6824 Body mass index (BMI) 24.0-24.9, adult: Secondary | ICD-10-CM | POA: Diagnosis not present

## 2021-01-14 DIAGNOSIS — L309 Dermatitis, unspecified: Secondary | ICD-10-CM | POA: Diagnosis not present

## 2021-01-22 DIAGNOSIS — D485 Neoplasm of uncertain behavior of skin: Secondary | ICD-10-CM | POA: Diagnosis not present

## 2021-01-22 DIAGNOSIS — L720 Epidermal cyst: Secondary | ICD-10-CM | POA: Diagnosis not present

## 2021-01-22 DIAGNOSIS — B353 Tinea pedis: Secondary | ICD-10-CM | POA: Diagnosis not present

## 2021-01-28 DIAGNOSIS — L989 Disorder of the skin and subcutaneous tissue, unspecified: Secondary | ICD-10-CM | POA: Diagnosis not present

## 2021-01-28 DIAGNOSIS — Z0000 Encounter for general adult medical examination without abnormal findings: Secondary | ICD-10-CM | POA: Diagnosis not present

## 2021-01-28 DIAGNOSIS — L309 Dermatitis, unspecified: Secondary | ICD-10-CM | POA: Diagnosis not present

## 2021-02-04 DIAGNOSIS — E875 Hyperkalemia: Secondary | ICD-10-CM | POA: Diagnosis not present

## 2021-03-26 DIAGNOSIS — B353 Tinea pedis: Secondary | ICD-10-CM | POA: Diagnosis not present

## 2021-04-22 DIAGNOSIS — M25511 Pain in right shoulder: Secondary | ICD-10-CM | POA: Diagnosis not present

## 2021-09-05 ENCOUNTER — Encounter (HOSPITAL_COMMUNITY): Payer: Self-pay | Admitting: Emergency Medicine

## 2021-09-05 ENCOUNTER — Emergency Department (HOSPITAL_COMMUNITY)
Admission: EM | Admit: 2021-09-05 | Discharge: 2021-09-06 | Disposition: A | Discharge status: Home / Self Care | Payer: BC Managed Care – PPO | Attending: Emergency Medicine | Admitting: Emergency Medicine

## 2021-09-05 ENCOUNTER — Other Ambulatory Visit: Payer: Self-pay

## 2021-09-05 ENCOUNTER — Emergency Department (HOSPITAL_COMMUNITY): Payer: BC Managed Care – PPO

## 2021-09-05 DIAGNOSIS — Z79899 Other long term (current) drug therapy: Secondary | ICD-10-CM | POA: Diagnosis not present

## 2021-09-05 DIAGNOSIS — R072 Precordial pain: Secondary | ICD-10-CM

## 2021-09-05 DIAGNOSIS — Z7982 Long term (current) use of aspirin: Secondary | ICD-10-CM | POA: Diagnosis not present

## 2021-09-05 DIAGNOSIS — I10 Essential (primary) hypertension: Secondary | ICD-10-CM | POA: Diagnosis not present

## 2021-09-05 DIAGNOSIS — R079 Chest pain, unspecified: Secondary | ICD-10-CM | POA: Diagnosis not present

## 2021-09-05 LAB — CBC
HCT: 45.6 % (ref 39.0–52.0)
Hemoglobin: 15.5 g/dL (ref 13.0–17.0)
MCH: 33.1 pg (ref 26.0–34.0)
MCHC: 34 g/dL (ref 30.0–36.0)
MCV: 97.4 fL (ref 80.0–100.0)
Platelets: 168 10*3/uL (ref 150–400)
RBC: 4.68 MIL/uL (ref 4.22–5.81)
RDW: 12.5 % (ref 11.5–15.5)
WBC: 5.5 10*3/uL (ref 4.0–10.5)
nRBC: 0 % (ref 0.0–0.2)

## 2021-09-05 LAB — TROPONIN I (HIGH SENSITIVITY)
Troponin I (High Sensitivity): 4 ng/L (ref <18)
Troponin I (High Sensitivity): 3 ng/L (ref <18)

## 2021-09-05 LAB — BASIC METABOLIC PANEL
Anion gap: 9 (ref 5–15)
BUN: 13 mg/dL (ref 6–20)
CO2: 28 mmol/L (ref 22–32)
Calcium: 9.1 mg/dL (ref 8.9–10.3)
Chloride: 102 mmol/L (ref 98–111)
Creatinine, Ser: 0.97 mg/dL (ref 0.61–1.24)
GFR, Estimated: >60 mL/min (ref >60)
Glucose, Bld: 97 mg/dL (ref 70–99)
Potassium: 3.5 mmol/L (ref 3.5–5.1)
Sodium: 139 mmol/L (ref 135–145)

## 2021-09-05 NOTE — Discharge Instructions (Addendum)
Your testing today does not show any signs of heart attack based on your EKG or your blood work. I would like for you to continue to take your medication including your aspirin every day If you should have recurrent or worsening chest pain please return to the emergency department Otherwise I do want you to follow-up with your heart doctor, see the [facility] cardiology phone number listed above.  You should be seen within 3 days.  We had a discussion tonight about how blockages in the heart can cause symptoms without having a heart attack so if you find that you are having more frequent pain, more intense pain, pain that is lasting longer or pain that is associated with sweating difficulty breathing or vomiting I do want you to return to the emergency department immediately.

## 2021-09-05 NOTE — ED Triage Notes (Addendum)
Pt with c/o L sided chest pain that started while he was at work around 2pm. Pt with hx of MI with stenting. Pt states he took 2 NTG prior to coming here. Pt also tested + for Covid on Thursday.

## 2021-09-05 NOTE — ED Provider Notes (Signed)
Care assumed from Dr. Hyacinth Meeker, patient with chest pain and negative initial work-up pending second troponin.  Repeat troponin is normal.  Patient is discharged to follow-up with PCP and cardiology.   Dione Booze, MD 09/06/21 0030

## 2021-09-05 NOTE — ED Provider Notes (Signed)
[facility] EMERGENCY DEPARTMENT Provider Note   CSN: [id number] Arrival date & time: 09/05/21  2032     History  Chief Complaint  Patient presents with   Chest Pain    Andre [name] is a 51 y.o. male.   Chest Pain  This patient is a 51 year old male who has a history of high cholesterol, hypertension as well as acid reflux and a history of seizure disorder on Dilantin.  The patient reports to me that he had a prior myocardial infarction many years ago in fact it was when he was the age 54.  A heart catheterization report from 2011 showed that the patient did have some in-stent restenosis but no aggressive measures were taken and medical therapy was maximized.  He has not seen a heart doctor in several years but is hoping to follow-up with Dr. Admission who takes care of the patient's family members including his father who also had a heart attack in his 60s  The patient presents today with a complaint of chest pain which started at 230 approximately 7 hours ago.  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  He started to have a discomfort in his chest which seems to get worse when he lays down and is not associated with nausea, vomiting, diaphoresis, coughing, fever, shortness of breath and has had no radiation of the pain to his shoulders jaw or back.  No swelling of the legs.  His symptoms are currently completely resolved.  He did take some nitroglycerin earlier today  Home Medications Prior to Admission medications   Medication Sig Start Date End Date Taking? Authorizing Provider  aspirin 325 MG tablet Take 325 mg by mouth every morning.     [provider]  atorvastatin (LIPITOR) 40 MG tablet Take 40 mg by mouth every morning.  07/23/17   [provider]  Ibuprofen 200 MG CAPS ibuprofen 200 mg capsule  Take 1 capsule every 6 hours by oral route.    [provider]  metoprolol (TOPROL-XL) 50 MG 24 hr tablet  TAKE 1 & 1/2 TABLETS DAILY 07/20/11   Hillary Bow, MD  nitroGLYCERIN (NITROSTAT) 0.3 MG SL tablet nitroglycerin 0.3 mg sublingual tablet  PLACE 1 TABLET UNDER THE TONGUE EVERY 5 MINUTES AS NEEDED FOR CHEST PAINS FOR 3 DOSES    [provider]  nitroGLYCERIN (NITROSTAT) 0.4 MG SL tablet Place 1 tablet (0.4 mg total) under the tongue every 5 (five) minutes as needed for chest pain (up to 3 doses in 15 mins). 01/25/12   Hillary Bow, MD  omeprazole (PRILOSEC OTC) 20 MG tablet omeprazole 20 mg tablet,delayed release  TAKE 1 TABLET BY MOUTH TWICE DAILY    [provider]  omeprazole (PRILOSEC) 20 MG capsule Take 20 mg by mouth every morning.     [provider]  omeprazole (PRILOSEC) 40 MG capsule Take 40 mg by mouth daily. 07/15/21   [provider]  phenytoin (DILANTIN) 100 MG ER capsule Take 400 mg by mouth every morning.  07/18/17   [provider]  predniSONE (DELTASONE) 5 MG tablet prednisone 5 mg tablets in a dose pack  Take 1 dose pk by oral route as directed for 6 days.    [provider]  terbinafine (LAMISIL) 250 MG tablet Take 250 mg by mouth daily. 03/27/21   [provider]      Allergies    Patient has no known  allergies.    Review of Systems   Review of Systems  Cardiovascular:  Positive for chest pain.  All other systems reviewed and are negative.  Physical Exam Updated Vital Signs BP 122/75    Pulse 74    Temp 98.1 F (36.7 C) (Oral)    Resp (!) 21    Ht 1.727 m (5\' 8" )    Wt 72.6 kg    SpO2 100%    BMI 24.33 kg/m  Physical Exam Vitals and nursing note reviewed.  Constitutional:      General: He is not in acute distress.    Appearance: He is well-developed.  HENT:     Head: Normocephalic and atraumatic.     Mouth/Throat:     Pharynx: No oropharyngeal exudate.  Eyes:     General: No scleral icterus.       Right eye: No discharge.        Left eye: No discharge.     Conjunctiva/sclera: Conjunctivae  normal.     Pupils: Pupils are equal, round, and reactive to light.  Neck:     Thyroid: No thyromegaly.     Vascular: No JVD.  Cardiovascular:     Rate and Rhythm: Normal rate and regular rhythm.     Heart sounds: Normal heart sounds. No murmur heard.   No friction rub. No gallop.  Pulmonary:     Effort: Pulmonary effort is normal. No respiratory distress.     Breath sounds: Normal breath sounds. No wheezing or rales.  Abdominal:     General: Bowel sounds are normal. There is no distension.     Palpations: Abdomen is soft. There is no mass.     Tenderness: There is no abdominal tenderness.  Musculoskeletal:        General: No tenderness. Normal range of motion.     Cervical back: Normal range of motion and neck supple.     Right lower leg: No edema.     Left lower leg: No edema.  Lymphadenopathy:     Cervical: No cervical adenopathy.  Skin:    General: Skin is warm and dry.     Findings: No erythema or rash.  Neurological:     Mental Status: He is alert.     Coordination: Coordination normal.  Psychiatric:        Behavior: Behavior normal.    ED Results / Procedures / Treatments   Labs (all labs ordered are listed, but only abnormal results are displayed) Labs Reviewed  BASIC METABOLIC PANEL  CBC  TROPONIN I (HIGH SENSITIVITY)  TROPONIN I (HIGH SENSITIVITY)    EKG EKG Interpretation  Date/Time:  Friday September 05 2021 20:47:39 EST Ventricular Rate:  77 PR Interval:  161 QRS Duration: 96 QT Interval:  372 QTC Calculation: 421 R Axis:   54 Text Interpretation: Sinus rhythm Borderline T wave abnormalities since last tracing no significant change Confirmed by Noemi Chapel [phone]) on 09/05/2021 9:19:09 PM  Radiology DG Chest Portable 1 View  Result Date: 09/05/2021 CLINICAL DATA:  Chest pain EXAM: PORTABLE CHEST 1 VIEW COMPARISON:  06/08/2019 FINDINGS: Lungs are well expanded, symmetric, and clear. No pneumothorax or pleural effusion. Cardiac size within normal  limits. Pulmonary vascularity is normal. Osseous structures are age-appropriate. No acute bone abnormality. IMPRESSION: No active disease. Electronically Signed   By: Fidela Salisbury M.D.   On: 09/05/2021 21:09    Procedures Procedures    Medications Ordered in ED Medications - No data to display  ED Course/  Medical Decision Making/ A&P                           Medical Decision Making The patient does have some level of baseline cardiac disease of concern His EKG is unremarkable, will follow with troponin and chest x-ray.  The patient is agreeable, he will need 2 negative troponins.  Amount and/or Complexity of Data Reviewed Independent Historian: parent    Details: Gives me the family history of cardiac disease External Data Reviewed: notes.    Details: Prior heart catheterization notes reviewed Labs: ordered.    Details: I personally interpreted the results, it appears that there is a normal troponin, normal metabolic panel and normal CBC, there is no acute findings, second opponent pending at the time of change Radiology: ordered and independent interpretation performed.    Details: Chest x-ray unremarkable no active disease ECG/medicine tests: ordered and independent interpretation performed. Decision-making details documented in ED Course.    Details: No acute ischemia Discussion of management or test interpretation with external provider(s): Discussed with oncoming emergency department physician Dr. Roxanne Mins, he will follow-up for second troponin and disposition accordingly, I anticipate discharge home if the patient is still chest pain-free and has a negative troponin, or a negative delta troponin  I considered admission to the hospital but without a positive troponin his symptoms are atypical and have resolved at the time of change of shift thus admission to the hospital is not necessary at this time.  I had a discussion with the patient regarding the risk benefits and  alternatives of going home, the symptoms to look for to return and he is in total agreement  Risk OTC drugs. Decision regarding hospitalization.           Final Clinical Impression(s) / ED Diagnoses Final diagnoses:  Precordial pain    Rx / DC Orders ED Discharge Orders     None         Noemi Chapel, MD 09/05/21 2306

## 2021-09-08 DIAGNOSIS — E663 Overweight: Secondary | ICD-10-CM | POA: Diagnosis not present

## 2021-09-08 DIAGNOSIS — Z1331 Encounter for screening for depression: Secondary | ICD-10-CM | POA: Diagnosis not present

## 2021-09-08 DIAGNOSIS — R002 Palpitations: Secondary | ICD-10-CM | POA: Diagnosis not present

## 2021-09-08 DIAGNOSIS — I200 Unstable angina: Secondary | ICD-10-CM | POA: Diagnosis not present

## 2021-09-24 NOTE — Progress Notes (Signed)
CARDIOLOGY CONSULT NOTE       Patient ID: Andre [name] MRN: [id number] DOB/AGE: 1970/11/04 51 y.o.  Admit date: (Not on file) Referring Physician: Gerarda Fraction Primary Physician: Redmond School, MD Primary Cardiologist: New Reason for Consultation: PVC/CAD    HPI:  51 y.o. referred by Dr Gerarda Fraction for CAD and PVC. 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 Resolved spontaneously R/O no acute ECG changes CXR NAD  Quit smoking in 2001 20 pack year Necrotizing pneumonia RUL 2019   Started on Imdur by primary Tolerating well Never gets headaches Since ER no palpitations or chest pain Compliant with meds Wife also works at [employer] He misses working outside as he worked at [employer] for 20 years before retiring   ROS All other systems reviewed and negative except as noted above  Past Medical History:  Diagnosis Date   Coronary artery disease 1999   stents x 2   GERD (gastroesophageal reflux disease)    Hypertension    MI (myocardial infarction) ([place])    Palpitations    Seizures ([place])    last seizure was in 1994    Family History  Problem Relation Age of Onset   Heart attack Mother    Heart attack Father    Lung disease Neg Hx     Social History   Socioeconomic History   Marital status: Married    Spouse name: Not on file   Number of children: Not on file   Years of education: Not on file   Highest education level: Not on file  Occupational History   Not on file  Tobacco Use   Smoking status: Former   Smokeless tobacco: Former    Types: Snuff    Quit date: 04/2018  Vaping Use   Vaping Use: Never used  Substance and Sexual Activity   Alcohol use: No   Drug use: Never   Sexual activity: Not on file  Other Topics Concern   Not on file  Social History  Narrative   Not on file   Social Determinants of Health   Financial Resource Strain: Not on file  Food Insecurity: Not on file  Transportation Needs: Not on file  Physical Activity: Not on file  Stress: Not on file  Social Connections: Not on file  Intimate Partner Violence: Not on file    Past Surgical History:  Procedure Laterality Date   APPENDECTOMY     BRAIN SURGERY     as an infant   CHOLECYSTECTOMY        Current Outpatient Medications:    aspirin 325 MG tablet, Take 325 mg by mouth every morning. , Disp: , Rfl:    atorvastatin (LIPITOR) 40 MG tablet, Take 40 mg by mouth every morning. , Disp: , Rfl: 1   Ibuprofen 200 MG CAPS, ibuprofen 200 mg capsule  Take 1 capsule every 6 hours by oral route., Disp: , Rfl:    isosorbide mononitrate (IMDUR) 30 MG 24 hr tablet, Take 30 mg by mouth daily., Disp: , Rfl:    metoprolol (TOPROL-XL) 50 MG 24 hr tablet, TAKE 1 & 1/2 TABLETS DAILY, Disp: 45 tablet, Rfl: 1   nitroGLYCERIN (NITROSTAT) 0.3 MG SL tablet, nitroglycerin 0.3 mg sublingual tablet  PLACE 1 TABLET UNDER THE  TONGUE EVERY 5 MINUTES AS NEEDED FOR CHEST PAINS FOR 3 DOSES, Disp: , Rfl:    omeprazole (PRILOSEC) 40 MG capsule, Take 40 mg by mouth daily., Disp: , Rfl:    phenytoin (DILANTIN) 100 MG ER capsule, Take 400 mg by mouth every morning. , Disp: , Rfl: 1   terbinafine (LAMISIL) 250 MG tablet, Take 250 mg by mouth daily. (Patient not taking: Reported on 10/02/2021), Disp: , Rfl:     Physical Exam: Blood pressure 122/80, pulse 82, height 5\' 8"  (1.727 m), weight 160 lb (72.6 kg), SpO2 99 %.   Affect appropriate Healthy:  appears stated age 61: normal Neck supple with no adenopathy JVP normal no bruits no thyromegaly Lungs clear with no wheezing and good diaphragmatic motion Heart:  S1/S2 no murmur, no rub, gallop or click PMI normal Abdomen: benighn, BS positve, no tenderness, no AAA no bruit.  No HSM or HJR Distal pulses intact with no bruits No edema Neuro  non-focal Skin warm and dry No muscular weakness   Labs:   Lab Results  Component Value Date   WBC 5.5 09/05/2021   HGB 15.5 09/05/2021   HCT 45.6 09/05/2021   MCV 97.4 09/05/2021   PLT 168 09/05/2021   No results for input(s): NA, K, CL, CO2, BUN, CREATININE, CALCIUM, PROT, BILITOT, ALKPHOS, ALT, AST, GLUCOSE in the last 168 hours.  Invalid input(s): LABALBU Lab Results  Component Value Date   CKTOTAL 127 07/17/2009   CKMB 1.4 07/17/2009   TROPONINI <0.03 04/17/2018   No results found for: CHOL No results found for: HDL No results found for: LDLCALC No results found for: TRIG No results found for: CHOLHDL No results found for: LDLDIRECT    Radiology: DG Chest Portable 1 View  Result Date: 09/05/2021 CLINICAL DATA:  Chest pain EXAM: PORTABLE CHEST 1 VIEW COMPARISON:  06/08/2019 FINDINGS: Lungs are well expanded, symmetric, and clear. No pneumothorax or pleural effusion. Cardiac size within normal limits. Pulmonary vascularity is normal. Osseous structures are age-appropriate. No acute bone abnormality. IMPRESSION: No active disease. Electronically Signed   By: Fidela Salisbury M.D.   On: 09/05/2021 21:09    EKG: 09/05/21 SR LVH cannot r/o old inferiorlateral MI   ASSESSMENT AND PLAN:   Chest Pain: distant stent to RCA with last cath 2010 40% stenosis RCA/LCX/LAD patent stent recent ER visit r/o No asymptomatic Continue ASA/Imdur/Lopressor and statin Discussed doing cath/stress test if symptoms recur  PVC:  continue beta blocker quiescent  Seizures: on Dilantin none recently  HLD:  continue statin labs with Dr Gerarda Fraction GERD:  low carb diet prilosec COPD:  Has seen Dr Learta Codding pulmonary history smoking , necrotising RUL pneumonia 2019 F/U pulmonary no active wheezing recent CXR NAD   F/U in 6 months   Signed: Jenkins Rouge 10/02/2021, 8:59 AM

## 2021-10-02 ENCOUNTER — Encounter: Payer: Self-pay | Admitting: Cardiovascular Disease

## 2021-10-02 ENCOUNTER — Ambulatory Visit (INDEPENDENT_AMBULATORY_CARE_PROVIDER_SITE_OTHER): Payer: BC Managed Care – PPO | Admitting: Cardiovascular Disease

## 2021-10-02 ENCOUNTER — Other Ambulatory Visit: Payer: Self-pay

## 2021-10-02 VITALS — BP 122/80 | HR 82 | Ht 68.0 in | Wt 160.0 lb

## 2021-10-02 DIAGNOSIS — I2510 Atherosclerotic heart disease of native coronary artery without angina pectoris: Secondary | ICD-10-CM | POA: Diagnosis not present

## 2021-10-02 DIAGNOSIS — I493 Ventricular premature depolarization: Secondary | ICD-10-CM

## 2021-10-02 DIAGNOSIS — E782 Mixed hyperlipidemia: Secondary | ICD-10-CM

## 2021-10-02 DIAGNOSIS — R002 Palpitations: Secondary | ICD-10-CM

## 2021-10-02 NOTE — Patient Instructions (Signed)
Medication Instructions:  Your physician recommends that you continue on your current medications as directed. Please refer to the Current Medication list given to you today.  *If you need a refill on your cardiac medications before your next appointment, please call your pharmacy*   Lab Work: NONE  If you have labs (blood work) drawn today and your tests are completely normal, you will receive your results only by: MyChart Message (if you have MyChart) OR A paper copy in the mail If you have any lab test that is abnormal or we need to change your treatment, we will call you to review the results.   Testing/Procedures: NONE    Follow-Up: At [facility], you and your health needs are our priority.  As part of our continuing mission to provide you with exceptional heart care, we have created designated Provider Care Teams.  These Care Teams include your primary Cardiologist (physician) and Advanced Practice Providers (APPs -  Physician Assistants and Nurse Practitioners) who all work together to provide you with the care you need, when you need it.  We recommend signing up for the patient portal called "MyChart".  Sign up information is provided on this After Visit Summary.  MyChart is used to connect with patients for Virtual Visits (Telemedicine).  Patients are able to view lab/test results, encounter notes, upcoming appointments, etc.  Non-urgent messages can be sent to your provider as well.   To learn more about what you can do with MyChart, go to [email or web address].    Your next appointment:   6 month(s)  The format for your next appointment:   In Person  Provider:   Peter Nishan, MD   Other Instructions Thank you for choosing [facility]!    

## 2021-10-27 DIAGNOSIS — Z6824 Body mass index (BMI) 24.0-24.9, adult: Secondary | ICD-10-CM | POA: Diagnosis not present

## 2021-10-27 DIAGNOSIS — E663 Overweight: Secondary | ICD-10-CM | POA: Diagnosis not present

## 2021-10-27 DIAGNOSIS — J019 Acute sinusitis, unspecified: Secondary | ICD-10-CM | POA: Diagnosis not present

## 2022-01-09 DIAGNOSIS — M25511 Pain in right shoulder: Secondary | ICD-10-CM | POA: Diagnosis not present

## 2022-03-06 DIAGNOSIS — N419 Inflammatory disease of prostate, unspecified: Secondary | ICD-10-CM | POA: Diagnosis not present

## 2022-03-06 DIAGNOSIS — Z6825 Body mass index (BMI) 25.0-25.9, adult: Secondary | ICD-10-CM | POA: Diagnosis not present

## 2022-03-06 DIAGNOSIS — N400 Enlarged prostate without lower urinary tract symptoms: Secondary | ICD-10-CM | POA: Diagnosis not present

## 2022-04-02 ENCOUNTER — Ambulatory Visit: Payer: BC Managed Care – PPO | Admitting: Cardiovascular Disease

## 2022-05-11 ENCOUNTER — Other Ambulatory Visit (HOSPITAL_COMMUNITY): Payer: Self-pay | Admitting: Family Medicine

## 2022-05-11 ENCOUNTER — Ambulatory Visit (HOSPITAL_COMMUNITY)
Admission: RE | Admit: 2022-05-11 | Discharge: 2022-05-11 | Disposition: A | Discharge status: Home / Self Care | Payer: BC Managed Care – PPO | Source: Ambulatory Visit | Attending: Family Medicine | Admitting: Family Medicine

## 2022-05-11 ENCOUNTER — Other Ambulatory Visit (HOSPITAL_COMMUNITY): Payer: Self-pay | Admitting: General Practice

## 2022-05-11 DIAGNOSIS — M545 Low back pain, unspecified: Secondary | ICD-10-CM | POA: Diagnosis not present

## 2022-05-11 DIAGNOSIS — M549 Dorsalgia, unspecified: Secondary | ICD-10-CM | POA: Diagnosis not present

## 2022-05-11 DIAGNOSIS — M62838 Other muscle spasm: Secondary | ICD-10-CM | POA: Diagnosis not present

## 2022-05-11 DIAGNOSIS — M62830 Muscle spasm of back: Secondary | ICD-10-CM

## 2022-05-11 DIAGNOSIS — M542 Cervicalgia: Secondary | ICD-10-CM | POA: Diagnosis not present

## 2022-05-15 DIAGNOSIS — R030 Elevated blood-pressure reading, without diagnosis of hypertension: Secondary | ICD-10-CM | POA: Diagnosis not present

## 2022-05-15 DIAGNOSIS — Z6825 Body mass index (BMI) 25.0-25.9, adult: Secondary | ICD-10-CM | POA: Diagnosis not present

## 2022-05-15 DIAGNOSIS — M545 Low back pain, unspecified: Secondary | ICD-10-CM | POA: Diagnosis not present

## 2022-05-15 DIAGNOSIS — E663 Overweight: Secondary | ICD-10-CM | POA: Diagnosis not present

## 2022-05-20 DIAGNOSIS — E663 Overweight: Secondary | ICD-10-CM | POA: Diagnosis not present

## 2022-05-20 DIAGNOSIS — M545 Low back pain, unspecified: Secondary | ICD-10-CM | POA: Diagnosis not present

## 2022-05-20 DIAGNOSIS — Z6825 Body mass index (BMI) 25.0-25.9, adult: Secondary | ICD-10-CM | POA: Diagnosis not present

## 2022-05-20 DIAGNOSIS — R030 Elevated blood-pressure reading, without diagnosis of hypertension: Secondary | ICD-10-CM | POA: Diagnosis not present

## 2022-06-10 IMAGING — DX DG CHEST 1V PORT
1 series · 1 of 1 positions shown · non-contrast
Comparison: 06/08/2019

CLINICAL DATA: Chest pain

EXAM:
PORTABLE CHEST 1 VIEW

[chest ap]
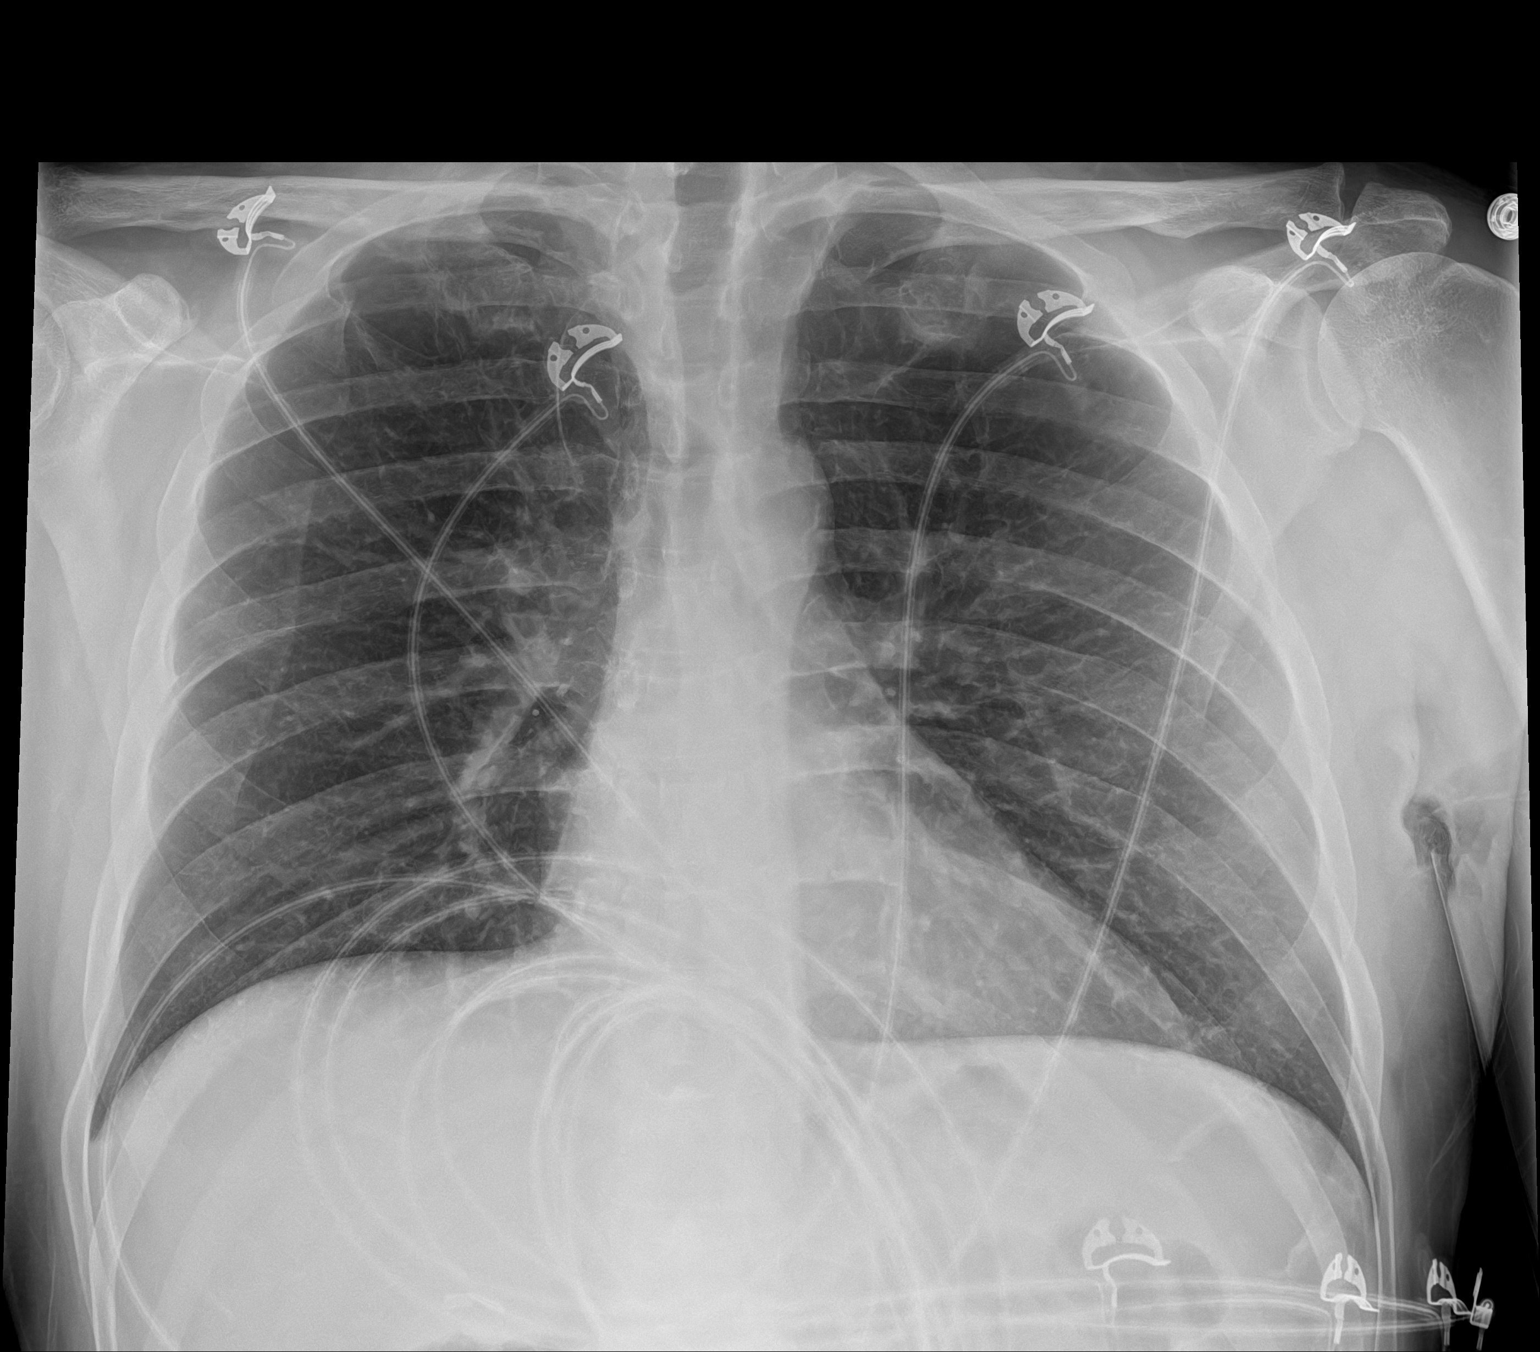

[1 of 1 positions shown; findings below may reference images not displayed]

FINDINGS: Lungs are well expanded, symmetric, and clear. No pneumothorax or
pleural effusion. Cardiac size within normal limits. Pulmonary
vascularity is normal. Osseous structures are age-appropriate. No
acute bone abnormality.
IMPRESSION: No active disease.

## 2022-10-09 DIAGNOSIS — J042 Acute laryngotracheitis: Secondary | ICD-10-CM | POA: Diagnosis not present

## 2022-10-09 DIAGNOSIS — J0100 Acute maxillary sinusitis, unspecified: Secondary | ICD-10-CM | POA: Diagnosis not present

## 2022-10-09 DIAGNOSIS — N400 Enlarged prostate without lower urinary tract symptoms: Secondary | ICD-10-CM | POA: Diagnosis not present

## 2022-11-05 ENCOUNTER — Telehealth: Payer: Self-pay | Admitting: Cardiovascular Disease

## 2022-11-05 NOTE — Telephone Encounter (Signed)
Called patient about his message. Patient stated yesterday morning after he started working he felt like it was hard to breath a little bit and he had palpitations. Patient stated this lasted for about an hour. Then he just had palpitations that have been going on since yesterday. This morning he started feeling like his heart is racing, not all the time but most of the time. Patient denies any chest pain. Patient stated other wise he feels pretty good, but the palpitations and racing heart has him worried. Informed patient that if his SOB comes back to go to the ED. Made patient an appointment with Dr. Angelena Form, DOD tomorrow. Will send message to Dr. Johnsie Cancel for further advisement.

## 2022-11-05 NOTE — Telephone Encounter (Signed)
Pt c/o Shortness Of Breath: STAT if SOB developed within the last 24 hours or pt is noticeably SOB on the phone  1. Are you currently SOB (can you hear that pt is SOB on the phone)? Not at this time  2. How long have you been experiencing SOB? Since yesterday  3. Are you SOB when sitting or when up moving around? When he moves around  4. Are you currently experiencing any other symptoms? Feels like his heart is racing at this time

## 2022-11-06 ENCOUNTER — Encounter: Payer: Self-pay | Admitting: Cardiovascular Disease

## 2022-11-06 ENCOUNTER — Ambulatory Visit: Payer: BC Managed Care – PPO | Attending: Cardiovascular Disease | Admitting: Cardiovascular Disease

## 2022-11-06 ENCOUNTER — Ambulatory Visit (INDEPENDENT_AMBULATORY_CARE_PROVIDER_SITE_OTHER): Payer: BC Managed Care – PPO

## 2022-11-06 VITALS — BP 138/72 | HR 76 | Ht 68.0 in | Wt 164.8 lb

## 2022-11-06 DIAGNOSIS — R002 Palpitations: Secondary | ICD-10-CM | POA: Diagnosis not present

## 2022-11-06 DIAGNOSIS — I2510 Atherosclerotic heart disease of native coronary artery without angina pectoris: Secondary | ICD-10-CM

## 2022-11-06 NOTE — Patient Instructions (Addendum)
Medication Instructions:  Your physician recommends that you continue on your current medications as directed. Please refer to the Current Medication list given to you today.  *If you need a refill on your cardiac medications before your next appointment, please call your pharmacy*   Lab Work: None ordered   If you have labs (blood work) drawn today and your tests are completely normal, you will receive your results only by: [place] (if you have MyChart) OR A paper copy in the mail If you have any lab test that is abnormal or we need to change your treatment, we will call you to review the results.   Testing/Procedures: Your physician has requested that you have an echocardiogram. Echocardiography is a painless test that uses sound waves to create images of your heart. It provides your doctor with information about the size and shape of your heart and how well your heart's chambers and valves are working. This procedure takes approximately one hour. There are no restrictions for this procedure. Please do NOT wear cologne, perfume, aftershave, or lotions (deodorant is allowed). Please arrive 15 minutes prior to your appointment time.  A zio monitor was ordered today. It will remain on for 14 days. You will then return monitor and event diary in provided box. It takes 1-2 weeks for report to be downloaded and returned to [place]. We will call you with the results. If monitor falls off or has orange flashing light, please call Zio for further instructions.     Follow-Up: At [facility], you and your health needs are our priority.  As part of our continuing mission to provide you with exceptional heart care, we have created designated Provider Care Teams.  These Care Teams include your primary Cardiologist (physician) and Advanced Practice Providers (APPs -  Physician Assistants and Nurse Practitioners) who all work together to provide you with the care you need, when you need  it.  We recommend signing up for the patient portal called "MyChart".  Sign up information is provided on this After Visit Summary.  MyChart is used to connect with patients for Virtual Visits (Telemedicine).  Patients are able to view lab/test results, encounter notes, upcoming appointments, etc.  Non-urgent messages can be sent to your provider as well.   To learn more about what you can do with MyChart, go to [email or web address].    Your next appointment:   3 - 4 week(s)  Provider:   Ermalinda Barrios, PA-C  Other Instructions ZIO XT- Long Term Monitor Instructions  Your physician has requested you wear a ZIO patch monitor for 14 days.  This is a single patch monitor. [employer] supplies one patch monitor per enrollment. Additional stickers are not available. Please do not apply patch if you will be having a Nuclear Stress Test,  Echocardiogram, Cardiac CT, MRI, or Chest Xray during the period you would be wearing the  monitor. The patch cannot be worn during these tests. You cannot remove and re-apply the  ZIO XT patch monitor.  Your ZIO patch monitor will be mailed 3 day USPS to your address on file. It may take 3-5 days  to receive your monitor after you have been enrolled.  Once you have received your monitor, please review the enclosed instructions. Your monitor  has already been registered assigning a specific monitor serial # to you.  Billing and Patient Assistance Program Information  We have supplied [employer] with any of your insurance information on file for billing purposes. [employer] offers a  sliding scale Patient Assistance Program for patients that do not have  insurance, or whose insurance does not completely cover the cost of the ZIO monitor.  You must apply for the Patient Assistance Program to qualify for this discounted rate.  To apply, please call [employer] at [phone], select option 4, select option 2, ask to apply for  Patient Assistance Program. Theodore Demark will ask  your household income, and how many people  are in your household. They will quote your out-of-pocket cost based on that information.  [employer] will also be able to set up a 5-month, interest-free payment plan if needed.  Applying the monitor   Shave hair from upper left chest.  Hold abrader disc by orange tab. Rub abrader in 40 strokes over the upper left chest as  indicated in your monitor instructions.  Clean area with 4 enclosed alcohol pads. Let dry.  Apply patch as indicated in monitor instructions. Patch will be placed under collarbone on left  side of chest with arrow pointing upward.  Rub patch adhesive wings for 2 minutes. Remove white label marked "1". Remove the white  label marked "2". Rub patch adhesive wings for 2 additional minutes.  While looking in a mirror, press and release button in center of patch. A small green light will  flash 3-4 times. This will be your only indicator that the monitor has been turned on.  Do not shower for the first 24 hours. You may shower after the first 24 hours.  Press the button if you feel a symptom. You will hear a small click. Record Date, Time and  Symptom in the Patient Logbook.  When you are ready to remove the patch, follow instructions on the last 2 pages of Patient  Logbook. Stick patch monitor onto the last page of Patient Logbook.  Place Patient Logbook in the blue and white box. Use locking tab on box and tape box closed  securely. The blue and white box has prepaid postage on it. Please place it in the mailbox as  soon as possible. Your physician should have your test results approximately 7 days after the  monitor has been mailed back to [facility].  Call [place] at [phone] if you have questions regarding  your ZIO XT patch monitor. Call them immediately if you see an orange light blinking on your  monitor.  If your monitor falls off in less than 4 days, contact our Monitor department at  [phone].  If your monitor becomes loose or falls off after 4 days call [employer] at [phone] for  suggestions on securing your monitor

## 2022-11-06 NOTE — Progress Notes (Unsigned)
Chief Complaint  Patient presents with   Follow-up    Palpitations    History of Present Illness:  52 yo male with history of CAD, COPD, GERD, HTN, hyperlipidemia, seizure disorder, PVCs, prior tobacco abuse who is added onto my schedule today for follow up. He is followed in our office by Dr. Johnsie Cancel. 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 Last cardiac cath in 2010 with patent RCA stent and mild non-obstructive disease in all three vessels. He called our office yesterday and reported dyspnea and palpitations. He tells me today that he went into work two days ago and had some dyspnea. He felt his heart racing. This lasted all day yesterday and has happened a few times today. His dyspnea did not occur again over the last 24 hours. No chest pain. No LE edema.   Primary Care Physician: Redmond School, MD  [facility] Cardiology: Johnsie Cancel  Past Medical History:  Diagnosis Date   Coronary artery disease 1999   stents x 2   GERD (gastroesophageal reflux disease)    Hypertension    MI (myocardial infarction) ([place])    Palpitations    Seizures ([place])    last seizure was in 1994    Past Surgical History:  Procedure Laterality Date   APPENDECTOMY     BRAIN SURGERY     as an infant   CHOLECYSTECTOMY      Current Outpatient Medications  Medication Sig Dispense Refill   aspirin 325 MG tablet Take 325 mg by mouth every morning.      atorvastatin (LIPITOR) 40 MG tablet Take 40 mg by mouth every morning.   1   Ibuprofen 200 MG CAPS ibuprofen 200 mg capsule  Take 1 capsule every 6 hours by oral route.     isosorbide mononitrate (IMDUR) 30 MG 24 hr tablet Take 30 mg by mouth daily.     metoprolol (TOPROL-XL) 50 MG 24 hr tablet TAKE 1 & 1/2 TABLETS DAILY 45 tablet 1   nitroGLYCERIN (NITROSTAT) 0.3 MG SL tablet nitroglycerin 0.3 mg sublingual tablet  PLACE 1 TABLET UNDER THE TONGUE EVERY 5 MINUTES AS NEEDED FOR CHEST PAINS FOR 3 DOSES     omeprazole (PRILOSEC) 40 MG capsule Take  40 mg by mouth daily.     phenytoin (DILANTIN) 100 MG ER capsule Take 400 mg by mouth every morning.   1   No current facility-administered medications for this visit.    No Known Allergies  Social History   Socioeconomic History   Marital status: Married    Spouse name: Not on file   Number of children: Not on file   Years of education: Not on file   Highest education level: Not on file  Occupational History   Not on file  Tobacco Use   Smoking status: Former   Smokeless tobacco: Former    Types: Snuff    Quit date: 04/2018  Vaping Use   Vaping Use: Never used  Substance and Sexual Activity   Alcohol use: No   Drug use: Never   Sexual activity: Not on file  Other Topics Concern   Not on file  Social History Narrative   Not on file   Social Determinants of Health   Financial Resource Strain: Not on file  Food Insecurity: Not on file  Transportation Needs: Not on file  Physical Activity: Not on file  Stress: Not on file  Social Connections: Not on file  Intimate Partner Violence:  Not on file    Family History  Problem Relation Age of Onset   Heart attack Mother    Heart attack Father    Lung disease Neg Hx     Review of Systems:  As stated in the HPI and otherwise negative.   BP 138/72   Pulse 76   Ht 5\' 8"  (1.727 m)   Wt 74.8 kg   SpO2 98%   BMI 25.06 kg/m   Physical Examination: General: Well developed, well nourished, NAD  HEENT: OP clear, mucus membranes moist  SKIN: warm, dry. No rashes. Neuro: No focal deficits  Musculoskeletal: Muscle strength 5/5 all ext  Psychiatric: Mood and affect normal  Neck: No JVD, no carotid bruits, no thyromegaly, no lymphadenopathy.  Lungs:Clear bilaterally, no wheezes, rhonci, crackles Cardiovascular: Regular rate and rhythm. No murmurs, gallops or rubs. Abdomen:Soft. Bowel sounds present. Non-tender.  Extremities: No lower extremity edema. Pulses are 2 + in the bilateral DP/PT.  EKG:  EKG is ordered  today. The ekg ordered today demonstrates NSR, rate 76 bpm. Inferior Q waves-unchanged   Recent Labs: No results found for requested labs within last 365 days.   Lipid Panel No results found for: "CHOL", "TRIG", "HDL", "CHOLHDL", "VLDL", "LDLCALC", "LDLDIRECT"   Wt Readings from Last 3 Encounters:  11/06/22 74.8 kg  10/02/21 72.6 kg  09/05/21 72.6 kg    Assessment and Plan:   1. CAD without angina: No chest pain. His recent episode of dyspnea occurred following the feeling that his heart was racing. See below. Will Continue ASA, statin, Imdur and beta blocker.   2. Palpitations/Dyspnea: EKG today shows normal sinus rhythm. His dyspnea is associated with the palpitations. Will arrange a 14 day Zio monitor. Will arrange an echo to assess LV function and exclude structural heart disease. If we don't find anything abnormal with this testing and he continues to have symptoms, will need consideration for repeat cardiac catheterization.   Labs/ tests ordered today include:   Orders Placed This Encounter  Procedures   LONG TERM MONITOR (3-14 DAYS)   EKG 12-Lead   ECHOCARDIOGRAM COMPLETE   Disposition:   F/U with Dr. Johnsie Cancel or office APP in 4-6 weeks   Signed, Lauree Chandler, MD, [facility] 11/08/2022 6:09 PM    [facility] [place], [place], [place]  [address] Phone: [phone]; Fax: [phone]

## 2022-11-06 NOTE — Progress Notes (Unsigned)
Applied a 14 day Zio XT monitor to patient in the office  Dr Nishan to read 

## 2022-11-12 ENCOUNTER — Ambulatory Visit: Payer: BC Managed Care – PPO | Admitting: Cardiovascular Disease

## 2022-11-19 DIAGNOSIS — R002 Palpitations: Secondary | ICD-10-CM | POA: Diagnosis not present

## 2022-11-19 DIAGNOSIS — E663 Overweight: Secondary | ICD-10-CM | POA: Diagnosis not present

## 2022-11-19 DIAGNOSIS — R569 Unspecified convulsions: Secondary | ICD-10-CM | POA: Diagnosis not present

## 2022-11-19 DIAGNOSIS — N400 Enlarged prostate without lower urinary tract symptoms: Secondary | ICD-10-CM | POA: Diagnosis not present

## 2022-11-19 DIAGNOSIS — I2510 Atherosclerotic heart disease of native coronary artery without angina pectoris: Secondary | ICD-10-CM | POA: Diagnosis not present

## 2022-11-19 DIAGNOSIS — Z6825 Body mass index (BMI) 25.0-25.9, adult: Secondary | ICD-10-CM | POA: Diagnosis not present

## 2022-11-19 DIAGNOSIS — I200 Unstable angina: Secondary | ICD-10-CM | POA: Diagnosis not present

## 2022-12-23 NOTE — Progress Notes (Deleted)
Cardiology Office Note:    Date:  12/23/2022   ID:  Andre [name], DOB 1971/03/02, MRN [id number]  PCP:  Andre Nevins, MD  [facility] Providers Cardiologist:  Andre Haws, MD { Click to update primary MD,subspecialty MD or APP then REFRESH:1}  *** Referring MD: Andre Nevins, MD   Chief Complaint:  No chief complaint on file. {Click here for Visit Info    :1}    History of Present Illness:   Andre [name] is a 52 y.o. male with  history of CAD-inf MI 2002 RCA stent and mild nonobstructive disease elsewhere, Last cardiac cath in 2010 with patent RCA stent and mild non-obstructive disease in all three vessels ,COPD, GERD, HTN, hyperlipidemia, seizure disorder, PVCs, prior tobacco abuse.   He was added onto Dr. [place] [name] schedule 10/2022 for dyspnea and palpitations. Patient of Dr. [place] [name].  Monitor showed NSR with some ventricular bigeminy and trigeminy. No changes made. Echo ordered      Past Medical History:  Diagnosis Date   Coronary artery disease 1999   stents x 2   GERD (gastroesophageal reflux disease)    Hypertension    MI (myocardial infarction) ([facility])    Palpitations    Seizures ([facility])    last seizure was in 1994   Current Medications: No outpatient medications have been marked as taking for the 12/28/22 encounter (Appointment) with Andre Kief, PA-C.    Allergies:   Patient has no known allergies.   Social History   Tobacco Use   Smoking status: Former   Smokeless tobacco: Former    Types: Snuff    Quit date: 04/2018  Vaping Use   Vaping Use: Never used  Substance Use Topics   Alcohol use: No   Drug use: Never    Family Hx: The patient's family history includes Heart attack in his father and mother. There is no history of Lung disease.  ROS     Physical Exam:    VS:  There were no vitals taken for this visit.    Wt Readings from Last 3 Encounters:  11/06/22 164 lb 12.8 oz (74.8 kg)  10/02/21 160 lb (72.6 kg)  09/05/21 160 lb  (72.6 kg)    Physical Exam  GEN: Well nourished, well developed, in no acute distress  HEENT: normal  Neck: no JVD, carotid bruits, or masses Cardiac:RRR; no murmurs, rubs, or gallops  Respiratory:  clear to auscultation bilaterally, normal work of breathing GI: soft, nontender, nondistended, + BS Ext: without cyanosis, clubbing, or edema, Good distal pulses bilaterally MS: no deformity or atrophy  Skin: warm and dry, no rash Neuro:  Alert and Oriented x 3, Strength and sensation are intact Psych: euthymic mood, full affect        EKGs/Labs/Other Test Reviewed:    EKG:  EKG is *** ordered today.  The ekg ordered today demonstrates ***  Recent Labs: No results found for requested labs within last 365 days.   Recent Lipid Panel No results for input(s): "CHOL", "TRIG", "HDL", "VLDL", "LDLCALC", "LDLDIRECT" in the last 8760 hours.   Prior CV Studies: {Select studies to display:26339}  Zio 10/2022 Patch Wear Time:  7 days and 2 hours (2024-03-22T17:07:25-0400 to 2024-03-29T20:03:32-0400)   Patient had a min HR of 49 bpm, max HR of 119 bpm, and avg HR of 78 bpm. Predominant underlying rhythm was Sinus Rhythm. Isolated SVEs were rare (<1.0%), and no SVE Couplets or SVE Triplets were present. Isolated VEs were rare (<1.0%), VE Couplets  were  rare (<1.0%), and no VE Triplets were present. Ventricular Bigeminy and Trigeminy were present.    Andre Haws MD [facility]     Risk Assessment/Calculations/Metrics:   {Does this patient have ATRIAL FIBRILLATION?:[id number]}     No BP recorded.  {Refresh Note OR Click here to enter BP  :1}***    ASSESSMENT & PLAN:   No problem-specific Assessment & Plan notes found for this encounter.   Dyspnea with palpitations-zio showed NSR with bigeminy and trigeminy  CAD-inf MI 2002 RCA stent and mild nonobstructive disease elsewhere, Last cardiac cath in 2010 with patent RCA stent and mild non-obstructive disease in all three vessels    HTN  COPD  HLD          {Are you ordering a CV Procedure (e.g. stress test, cath, DCCV, TEE, etc)?   Press F2        :[id number]}   Dispo:  No follow-ups on file.   Medication Adjustments/Labs and Tests Ordered: Current medicines are reviewed at length with the patient today.  Concerns regarding medicines are outlined above.  Tests Ordered: No orders of the defined types were placed in this encounter.  Medication Changes: No orders of the defined types were placed in this encounter.  Elson Clan, PA-C  12/23/2022 7:38 AM    [facility] [address] [place], [place], [place]  [address] Phone: [phone]; Fax: [phone]

## 2022-12-28 ENCOUNTER — Ambulatory Visit: Payer: BC Managed Care – PPO | Admitting: Physician Assistant

## 2023-04-23 DIAGNOSIS — Z6824 Body mass index (BMI) 24.0-24.9, adult: Secondary | ICD-10-CM | POA: Diagnosis not present

## 2023-04-23 DIAGNOSIS — M5441 Lumbago with sciatica, right side: Secondary | ICD-10-CM | POA: Diagnosis not present

## 2023-08-16 DIAGNOSIS — J029 Acute pharyngitis, unspecified: Secondary | ICD-10-CM | POA: Diagnosis not present

## 2023-08-16 DIAGNOSIS — J040 Acute laryngitis: Secondary | ICD-10-CM | POA: Diagnosis not present

## 2023-08-16 DIAGNOSIS — J060 Acute laryngopharyngitis: Secondary | ICD-10-CM | POA: Diagnosis not present

## 2023-08-16 DIAGNOSIS — R070 Pain in throat: Secondary | ICD-10-CM | POA: Diagnosis not present

## 2023-09-02 DIAGNOSIS — R6889 Other general symptoms and signs: Secondary | ICD-10-CM | POA: Diagnosis not present

## 2023-09-02 DIAGNOSIS — Z20828 Contact with and (suspected) exposure to other viral communicable diseases: Secondary | ICD-10-CM | POA: Diagnosis not present

## 2023-09-02 DIAGNOSIS — J069 Acute upper respiratory infection, unspecified: Secondary | ICD-10-CM | POA: Diagnosis not present

## 2023-09-02 DIAGNOSIS — Z6824 Body mass index (BMI) 24.0-24.9, adult: Secondary | ICD-10-CM | POA: Diagnosis not present

## 2023-09-02 DIAGNOSIS — I2510 Atherosclerotic heart disease of native coronary artery without angina pectoris: Secondary | ICD-10-CM | POA: Diagnosis not present

## 2023-09-02 DIAGNOSIS — R569 Unspecified convulsions: Secondary | ICD-10-CM | POA: Diagnosis not present

## 2024-01-18 DIAGNOSIS — R569 Unspecified convulsions: Secondary | ICD-10-CM | POA: Diagnosis not present

## 2024-01-18 DIAGNOSIS — N400 Enlarged prostate without lower urinary tract symptoms: Secondary | ICD-10-CM | POA: Diagnosis not present

## 2024-01-18 DIAGNOSIS — Z0001 Encounter for general adult medical examination with abnormal findings: Secondary | ICD-10-CM | POA: Diagnosis not present

## 2024-01-18 DIAGNOSIS — Z6824 Body mass index (BMI) 24.0-24.9, adult: Secondary | ICD-10-CM | POA: Diagnosis not present

## 2024-01-18 DIAGNOSIS — I2510 Atherosclerotic heart disease of native coronary artery without angina pectoris: Secondary | ICD-10-CM | POA: Diagnosis not present

## 2024-02-07 DIAGNOSIS — M25511 Pain in right shoulder: Secondary | ICD-10-CM | POA: Diagnosis not present

## 2024-06-16 DIAGNOSIS — M25511 Pain in right shoulder: Secondary | ICD-10-CM | POA: Diagnosis not present

## 2024-07-03 ENCOUNTER — Ambulatory Visit
Admission: EM | Admit: 2024-07-03 | Discharge: 2024-07-03 | Disposition: A | Discharge status: Home / Self Care | Attending: Nurse Practitioner | Admitting: Nurse Practitioner

## 2024-07-03 ENCOUNTER — Telehealth: Payer: Self-pay

## 2024-07-03 DIAGNOSIS — S39012A Strain of muscle, fascia and tendon of lower back, initial encounter: Secondary | ICD-10-CM

## 2024-07-03 MED ORDER — TIZANIDINE HCL 2 MG PO TABS: 2.0000 mg | ORAL_TABLET | Freq: Three times a day (TID) | ORAL | 0 refills | Status: AC | PRN

## 2024-07-03 MED ORDER — TIZANIDINE HCL 2 MG PO TABS: 2.0000 mg | ORAL_TABLET | Freq: Three times a day (TID) | ORAL | 0 refills | Status: DC | PRN

## 2024-07-03 NOTE — Telephone Encounter (Signed)
 Pt would like medication sent to [facility] on [address] in [place]. Resent medications there.

## 2024-07-03 NOTE — Discharge Instructions (Signed)
 As we discussed, your symptoms are consistent with a pulled muscle in your back.  Continue Tylenol  and ice/heat as well as gentle range of motion to help with pain and inflammation in your back.  Start the tizanidine and try the back exercises attached in your packet as well.  Seek care if symptoms worsen or do not improve with treatment.

## 2024-07-03 NOTE — ED Provider Notes (Signed)
 [facility]    CSN: [id number] Arrival date & time: 07/03/24  1019      History   Chief Complaint Chief Complaint  Patient presents with   Back Pain    HPI Andre [name] is a 53 y.o. male.   Patient presents today for acute left low back pain that began after lifting heavy boxes and twisting.  He denies hearing any pop or feeling a pull in his back at the time of pain.  No new urinary or bowel incontinence, numbness in between legs, numbness or tingling shooting down legs, or weakness of lower extremities.  No shooting pain down the legs or numbness/tingling in the toes.  Denies any new urinary symptoms.  Has taken Tylenol , ibuprofen, and tried heating pad and ice as well as topical pain relievers without significant benefit.  Reports history of same and in the past and has received a shot from his primary care provider.  Denies history of back surgeries.    Past Medical History:  Diagnosis Date   Coronary artery disease 1999   stents x 2   GERD (gastroesophageal reflux disease)    Hypertension    MI (myocardial infarction) ([facility])    Palpitations    Seizures ([facility])    last seizure was in 1994    Patient Active Problem List   Diagnosis Date Noted   Acute respiratory failure with hypoxia (HCC) 04/18/2018   Necrotizing pneumonia (HCC)    Transaminasemia    Cavitary pneumonia 04/17/2018   ABDOMINAL PAIN OTHER SPECIFIED SITE 05/20/2010   PALPITATIONS 01/23/2010   PREMATURE VENTRICULAR CONTRACTIONS 09/05/2009   CAD, NATIVE VESSEL 10/04/2008   TRIGGER FINGER 05/23/2008   FINGER PAIN 05/23/2008   PHARYNGITIS, ACUTE 11/15/2007   ALLERGIC RHINITIS, SEASONAL 12/14/2006   DIZZINESS 12/14/2006   Dyslipidemia 07/16/2006   Essential hypertension 07/16/2006   Coronary atherosclerosis 07/16/2006   HEMORRHOIDS, INTERNAL 07/16/2006   GERD 07/16/2006   EMBOLISM, OB BLOOD-CLOT , UNSPECIFIED EPISODE 07/16/2006   Seizure disorder (HCC) 07/16/2006    Past Surgical  History:  Procedure Laterality Date   APPENDECTOMY     BRAIN SURGERY     as an infant   CHOLECYSTECTOMY         Home Medications    Prior to Admission medications   Medication Sig Start Date End Date Taking? Authorizing Provider  aspirin  325 MG tablet Take 325 mg by mouth every morning.    Yes [provider]  atorvastatin  (LIPITOR) 40 MG tablet Take 40 mg by mouth every morning.  07/23/17  Yes [provider]  Ibuprofen 200 MG CAPS ibuprofen 200 mg capsule  Take 1 capsule every 6 hours by oral route.   Yes [provider]  isosorbide mononitrate (IMDUR) 30 MG 24 hr tablet Take 30 mg by mouth daily. 09/08/21  Yes [provider]  metoprolol  (TOPROL -XL) 50 MG 24 hr tablet TAKE 1 & 1/2 TABLETS DAILY 07/20/11  Yes Morris Debby BIRCH, MD  omeprazole (PRILOSEC) 40 MG capsule Take 40 mg by mouth daily. 07/15/21  Yes [provider]  phenytoin  (DILANTIN ) 100 MG ER capsule Take 400 mg by mouth every morning.  07/18/17  Yes [provider]  nitroGLYCERIN (NITROSTAT) 0.3 MG SL tablet nitroglycerin 0.3 mg sublingual tablet  PLACE 1 TABLET UNDER THE TONGUE EVERY 5 MINUTES AS NEEDED FOR CHEST PAINS FOR 3 DOSES    [provider]  tiZANidine (ZANAFLEX) 2 MG tablet Take 1 tablet (2 mg total) by mouth every  8 (eight) hours as needed for muscle spasms. 07/03/24   Chandra Harlene LABOR, NP    Family History Family History  Problem Relation Age of Onset   Heart attack Mother    Heart attack Father    Lung disease Neg Hx     Social History Social History   Tobacco Use   Smoking status: Former   Smokeless tobacco: Former    Types: Snuff    Quit date: 04/2018  Vaping Use   Vaping status: Never Used  Substance Use Topics   Alcohol use: No   Drug use: Never     Allergies   Patient has no known allergies.   Review of Systems Review of Systems Per HPI  Physical Exam Triage Vital Signs ED Triage Vitals [07/03/24 1146]   Encounter Vitals Group     BP 134/86     Girls Systolic BP Percentile      Girls Diastolic BP Percentile      Boys Systolic BP Percentile      Boys Diastolic BP Percentile      Pulse Rate 72     Resp 16     Temp 98.4 F (36.9 C)     Temp Source Oral     SpO2 98 %     Weight      Height      Head Circumference      Peak Flow      Pain Score 8     Pain Loc      Pain Education      Exclude from Growth Chart    No data found.  Updated Vital Signs BP 134/86 (BP Location: Right Arm)   Pulse 72   Temp 98.4 F (36.9 C) (Oral)   Resp 16   SpO2 98%   Visual Acuity Right Eye Distance:   Left Eye Distance:   Bilateral Distance:    Right Eye Near:   Left Eye Near:    Bilateral Near:     Physical Exam Vitals and nursing note reviewed.  Constitutional:      General: He is not in acute distress.    Appearance: Normal appearance. He is not toxic-appearing.  Pulmonary:     Effort: Pulmonary effort is normal. No respiratory distress.  Musculoskeletal:       Back:     Right lower leg: No edema.     Left lower leg: No edema.     Comments: Inspection: no swelling, bruising, obvious deformity or redness to left low back Palpation: tender to palpation to area marked approximately; no obvious deformities palpated ROM: Full ROM to lumbar spine Strength: 5/5 bilateral lower extremities Neurovascular: neurovascularly intact in distal bilateral lower extremities   Skin:    General: Skin is warm and dry.     Capillary Refill: Capillary refill takes less than 2 seconds.     Coloration: Skin is not jaundiced or pale.     Findings: No erythema.  Neurological:     Mental Status: He is alert and oriented to person, place, and time.  Psychiatric:        Behavior: Behavior is cooperative.      UC Treatments / Results  Labs (all labs ordered are listed, but only abnormal results are displayed) Labs Reviewed - No data to display  EKG   Radiology No results  found.  Procedures Procedures (including critical care time)  Medications Ordered in UC Medications - No data to display  Initial Impression / Assessment  and Plan / UC Course  I have reviewed the triage vital signs and the nursing notes.  Pertinent labs & imaging results that were available during my care of the patient were reviewed by me and considered in my medical decision making (see chart for details).   In triage, vital signs are stable and patient is well-appearing.  Suspect lumbar strain.  Given cardiac history, will defer corticosteroids or NSAIDs at this time and treat with muscle relaxants.  Can also continue Tylenol , heat/ice, and stretches/exercises given today.  Follow-up with primary care provider with no improvement in symptoms; if symptoms worsen, recommended follow-up in the emergency room.  Work excuse provided.  The patient was given the opportunity to ask questions.  All questions answered to their satisfaction.  The patient is in agreement to this plan.   Final Clinical Impressions(s) / UC Diagnoses   Final diagnoses:  Strain of lumbar region, initial encounter     Discharge Instructions      As we discussed, your symptoms are consistent with a pulled muscle in your back.  Continue Tylenol  and ice/heat as well as gentle range of motion to help with pain and inflammation in your back.  Start the tizanidine and try the back exercises attached in your packet as well.  Seek care if symptoms worsen or do not improve with treatment.    ED Prescriptions     Medication Sig Dispense Auth. Provider   tiZANidine (ZANAFLEX) 2 MG tablet Take 1 tablet (2 mg total) by mouth every 8 (eight) hours as needed for muscle spasms. 30 tablet Chandra Harlene LABOR, NP      PDMP not reviewed this encounter.   Chandra Harlene LABOR, NP 07/03/24 1243

## 2024-07-03 NOTE — ED Triage Notes (Signed)
 Pt states he thinks he may have pulled a muscle in his lower back a week ago. Now having throbbing pain in his left lower back/hip x 1 week. Tried heating pad, ice, tylenol  and ibuprofen with no relief of symptoms.

## 2024-08-16 DIAGNOSIS — I25119 Atherosclerotic heart disease of native coronary artery with unspecified angina pectoris: Secondary | ICD-10-CM | POA: Diagnosis not present

## 2024-08-16 DIAGNOSIS — E782 Mixed hyperlipidemia: Secondary | ICD-10-CM | POA: Diagnosis not present

## 2024-08-16 DIAGNOSIS — M25511 Pain in right shoulder: Secondary | ICD-10-CM | POA: Diagnosis not present

## 2024-08-16 DIAGNOSIS — G8929 Other chronic pain: Secondary | ICD-10-CM | POA: Diagnosis not present

## 2024-09-06 ENCOUNTER — Other Ambulatory Visit (HOSPITAL_COMMUNITY): Payer: Self-pay | Admitting: Nurse Practitioner

## 2024-09-06 ENCOUNTER — Ambulatory Visit (HOSPITAL_COMMUNITY)
Admission: RE | Admit: 2024-09-06 | Discharge: 2024-09-06 | Disposition: A | Discharge status: Home / Self Care | Source: Ambulatory Visit | Attending: Nurse Practitioner | Admitting: Nurse Practitioner

## 2024-09-06 DIAGNOSIS — J111 Influenza due to unidentified influenza virus with other respiratory manifestations: Secondary | ICD-10-CM
